# Patient Record
Sex: Female | Born: 1990
Health system: Southern US, Community
[De-identification: ages and names within clinical notes are randomized; demographics above are authoritative.]

## PROBLEM LIST (undated history)

## (undated) ENCOUNTER — Inpatient Hospital Stay (HOSPITAL_COMMUNITY): Payer: Self-pay

## (undated) DIAGNOSIS — B999 Unspecified infectious disease: Secondary | ICD-10-CM

## (undated) DIAGNOSIS — A6000 Herpesviral infection of urogenital system, unspecified: Secondary | ICD-10-CM

## (undated) DIAGNOSIS — D649 Anemia, unspecified: Secondary | ICD-10-CM

## (undated) HISTORY — PX: NO PAST SURGERIES: SHX2092

---
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
## 2011-05-12 ENCOUNTER — Other Ambulatory Visit: Payer: Self-pay | Admitting: Family Medicine

## 2011-05-13 ENCOUNTER — Ambulatory Visit
Admission: RE | Admit: 2011-05-13 | Discharge: 2011-05-13 | Disposition: A | Discharge status: Home / Self Care | Payer: Federal, State, Local not specified - PPO | Source: Ambulatory Visit | Attending: Family Medicine | Admitting: Family Medicine

## 2013-06-13 ENCOUNTER — Other Ambulatory Visit (HOSPITAL_COMMUNITY)
Admission: RE | Admit: 2013-06-13 | Discharge: 2013-06-13 | Disposition: A | Discharge status: Home / Self Care | Payer: Federal, State, Local not specified - PPO | Source: Ambulatory Visit | Attending: Obstetrics and Gynecology | Admitting: Obstetrics and Gynecology

## 2013-06-13 DIAGNOSIS — Z01419 Encounter for gynecological examination (general) (routine) without abnormal findings: Secondary | ICD-10-CM | POA: Insufficient documentation

## 2013-06-13 DIAGNOSIS — Z113 Encounter for screening for infections with a predominantly sexual mode of transmission: Secondary | ICD-10-CM | POA: Insufficient documentation

## 2014-06-12 ENCOUNTER — Other Ambulatory Visit (HOSPITAL_COMMUNITY)
Admission: RE | Admit: 2014-06-12 | Discharge: 2014-06-12 | Disposition: A | Discharge status: Home / Self Care | Payer: Federal, State, Local not specified - PPO | Source: Ambulatory Visit | Attending: Obstetrics and Gynecology | Admitting: Obstetrics and Gynecology

## 2014-06-12 ENCOUNTER — Other Ambulatory Visit: Payer: Self-pay | Admitting: Obstetrics and Gynecology

## 2014-06-12 DIAGNOSIS — Z01419 Encounter for gynecological examination (general) (routine) without abnormal findings: Secondary | ICD-10-CM | POA: Insufficient documentation

## 2014-06-12 DIAGNOSIS — Z1151 Encounter for screening for human papillomavirus (HPV): Secondary | ICD-10-CM | POA: Insufficient documentation

## 2014-06-16 LAB — CYTOLOGY - PAP

## 2015-06-12 ENCOUNTER — Emergency Department (HOSPITAL_COMMUNITY)
Admission: EM | Admit: 2015-06-12 | Discharge: 2015-06-12 | Disposition: A | Discharge status: Home / Self Care | Payer: Federal, State, Local not specified - PPO | Attending: Emergency Medicine | Admitting: Emergency Medicine

## 2015-06-12 ENCOUNTER — Encounter (HOSPITAL_COMMUNITY): Payer: Self-pay

## 2015-06-12 DIAGNOSIS — M791 Myalgia, unspecified site: Secondary | ICD-10-CM

## 2015-06-12 DIAGNOSIS — Z3202 Encounter for pregnancy test, result negative: Secondary | ICD-10-CM | POA: Diagnosis not present

## 2015-06-12 DIAGNOSIS — G4489 Other headache syndrome: Secondary | ICD-10-CM

## 2015-06-12 DIAGNOSIS — R109 Unspecified abdominal pain: Secondary | ICD-10-CM | POA: Diagnosis not present

## 2015-06-12 DIAGNOSIS — R52 Pain, unspecified: Secondary | ICD-10-CM | POA: Diagnosis present

## 2015-06-12 DIAGNOSIS — J039 Acute tonsillitis, unspecified: Secondary | ICD-10-CM | POA: Diagnosis not present

## 2015-06-12 LAB — URINALYSIS, ROUTINE W REFLEX MICROSCOPIC
BILIRUBIN URINE: NEGATIVE
Glucose, UA: NEGATIVE mg/dL
KETONES UR: 15 mg/dL — AB
NITRITE: NEGATIVE
PROTEIN: NEGATIVE mg/dL
Specific Gravity, Urine: 1.03 (ref 1.005–1.030)
UROBILINOGEN UA: 1 mg/dL (ref 0.0–1.0)
pH: 7 (ref 5.0–8.0)

## 2015-06-12 LAB — PREGNANCY, URINE: PREG TEST UR: NEGATIVE

## 2015-06-12 LAB — RAPID STREP SCREEN (MED CTR MEBANE ONLY): STREPTOCOCCUS, GROUP A SCREEN (DIRECT): NEGATIVE

## 2015-06-12 LAB — URINE MICROSCOPIC-ADD ON

## 2015-06-12 MED ORDER — TRAMADOL HCL 50 MG PO TABS: 50.0000 mg | ORAL_TABLET | Freq: Four times a day (QID) | ORAL | Status: DC | PRN

## 2015-06-12 MED ORDER — PENICILLIN V POTASSIUM 500 MG PO TABS: 500.0000 mg | ORAL_TABLET | Freq: Two times a day (BID) | ORAL | Status: AC

## 2015-06-12 MED ORDER — IBUPROFEN 600 MG PO TABS: 600.0000 mg | ORAL_TABLET | Freq: Four times a day (QID) | ORAL | Status: DC | PRN

## 2015-06-12 MED ORDER — DEXAMETHASONE SODIUM PHOSPHATE 10 MG/ML IJ SOLN
10.0000 mg | Freq: Once | INTRAMUSCULAR | Status: AC
  Administered 2015-06-12: 10 mg via INTRAMUSCULAR
  Filled 2015-06-12: qty 1

## 2015-06-12 MED ORDER — ACETAMINOPHEN 325 MG PO TABS
650.0000 mg | ORAL_TABLET | ORAL | Status: DC | PRN
  Administered 2015-06-12: 650 mg via ORAL
  Filled 2015-06-12 (×2): qty 2

## 2015-06-12 MED ORDER — KETOROLAC TROMETHAMINE 60 MG/2ML IM SOLN
60.0000 mg | Freq: Once | INTRAMUSCULAR | Status: AC
  Administered 2015-06-12: 60 mg via INTRAMUSCULAR
  Filled 2015-06-12: qty 2

## 2015-06-12 NOTE — ED Notes (Signed)
Per pt she started having  Sudden onset of generalized body aches at 1900 this evening; pt states she also has a sore throat; pt denies n/v; Pt states she believes she had a fever earlier but has not taking any meds; Pt a&ox 4 on arrival

## 2015-06-12 NOTE — Discharge Instructions (Signed)
Tonsillitis Tonsillitis is an infection of the throat that causes the tonsils to become red, tender, and swollen. Tonsils are collections of lymphoid tissue at the back of the throat. Each tonsil has crevices (crypts). Tonsils help fight nose and throat infections and keep infection from spreading to other parts of the body for the first 18 months of life.  CAUSES Sudden (acute) tonsillitis is usually caused by infection with streptococcal bacteria. Long-lasting (chronic) tonsillitis occurs when the crypts of the tonsils become filled with pieces of food and bacteria, which makes it easy for the tonsils to become repeatedly infected. SYMPTOMS  Symptoms of tonsillitis include:  A sore throat, with possible difficulty swallowing.  White patches on the tonsils.  Fever.  Tiredness.  New episodes of snoring during sleep, when you did not snore before.  Small, foul-smelling, yellowish-white pieces of material (tonsilloliths) that you occasionally cough up or spit out. The tonsilloliths can also cause you to have bad breath. DIAGNOSIS Tonsillitis can be diagnosed through a physical exam. Diagnosis can be confirmed with the results of lab tests, including a throat culture. TREATMENT  The goals of tonsillitis treatment include the reduction of the severity and duration of symptoms and prevention of associated conditions. Symptoms of tonsillitis can be improved with the use of steroids to reduce the swelling. Tonsillitis caused by bacteria can be treated with antibiotic medicines. Usually, treatment with antibiotic medicines is started before the cause of the tonsillitis is known. However, if it is determined that the cause is not bacterial, antibiotic medicines will not treat the tonsillitis. If attacks of tonsillitis are severe and frequent, your health care provider may recommend surgery to remove the tonsils (tonsillectomy). HOME CARE INSTRUCTIONS   Rest as much as possible and get plenty of  sleep.  Drink plenty of fluids. While the throat is very sore, eat soft foods or liquids, such as sherbet, soups, or instant breakfast drinks.  Eat frozen ice pops.  Gargle with a warm or cold liquid to help soothe the throat. Mix 1/4 teaspoon of salt and 1/4 teaspoon of baking soda in 8 oz of water. SEEK MEDICAL CARE IF:   Large, tender lumps develop in your neck.  A rash develops.  A green, yellow-brown, or bloody substance is coughed up.  You are unable to swallow liquids or food for 24 hours.  You notice that only one of the tonsils is swollen. SEEK IMMEDIATE MEDICAL CARE IF:   You develop any new symptoms such as vomiting, severe headache, stiff neck, chest pain, or trouble breathing or swallowing.  You have severe throat pain along with drooling or voice changes.  You have severe pain, unrelieved with recommended medications.  You are unable to fully open the mouth.  You develop redness, swelling, or severe pain anywhere in the neck.  You have a fever. MAKE SURE YOU:   Understand these instructions.  Will watch your condition.  Will get help right away if you are not doing well or get worse. Document Released: 06/08/2005 Document Revised: 01/13/2014 Document Reviewed: 02/15/2013 [facility] Patient Information 2015 [place], [place]. This information is not intended to replace advice given to you by your health care provider. Make sure you discuss any questions you have with your health care provider.  Muscle Pain Muscle pain (myalgia) may be caused by many things, including:  Overuse or muscle strain, especially if you are not in shape. This is the most common cause of muscle pain.  Injury.  Bruises.  Viruses, such as the  flu.  Infectious diseases.  Fibromyalgia, which is a chronic condition that causes muscle tenderness, fatigue, and headache.  Autoimmune diseases, including lupus.  Certain drugs, including ACE inhibitors and statins. Muscle pain may be  mild or severe. In most cases, the pain lasts only a short time and goes away without treatment. To diagnose the cause of your muscle pain, your health care provider will take your medical history. This means he or she will ask you when your muscle pain began and what has been happening. If you have not had muscle pain for very long, your health care provider may want to wait before doing much testing. If your muscle pain has lasted a long time, your health care provider may want to run tests right away. If your health care provider thinks your muscle pain may be caused by illness, you may need to have additional tests to rule out certain conditions.  Treatment for muscle pain depends on the cause. Home care is often enough to relieve muscle pain. Your health care provider may also prescribe anti-inflammatory medicine. HOME CARE INSTRUCTIONS Watch your condition for any changes. The following actions may help to lessen any discomfort you are feeling:  Only take over-the-counter or prescription medicines as directed by your health care provider.  Apply ice to the sore muscle:  Put ice in a plastic bag.  Place a towel between your skin and the bag.  Leave the ice on for 15-20 minutes, 3-4 times a day.  You may alternate applying hot and cold packs to the muscle as directed by your health care provider.  If overuse is causing your muscle pain, slow down your activities until the pain goes away.  Remember that it is normal to feel some muscle pain after starting a workout program. Muscles that have not been used often will be sore at first.  Do regular, gentle exercises if you are not usually active.  Warm up before exercising to lower your risk of muscle pain.  Do not continue working out if the pain is very bad. Bad pain could mean you have injured a muscle. SEEK MEDICAL CARE IF:  Your muscle pain gets worse, and medicines do not help.  You have muscle pain that lasts longer than 3  days.  You have a rash or fever along with muscle pain.  You have muscle pain after a tick bite.  You have muscle pain while working out, even though you are in good physical condition.  You have redness, soreness, or swelling along with muscle pain.  You have muscle pain after starting a new medicine or changing the dose of a medicine. SEEK IMMEDIATE MEDICAL CARE IF:  You have trouble breathing.  You have trouble swallowing.  You have muscle pain along with a stiff neck, fever, and vomiting.  You have severe muscle weakness or cannot move part of your body. MAKE SURE YOU:   Understand these instructions.  Will watch your condition.  Will get help right away if you are not doing well or get worse. Document Released: 07/21/2006 Document Revised: 09/03/2013 Document Reviewed: 06/25/2013 [facility] Patient Information 2015 [place], [place]. This information is not intended to replace advice given to you by your health care provider. Make sure you discuss any questions you have with your health care provider.  General Headache Without Cause A headache is pain or discomfort felt around the head or neck area. The specific cause of a headache may not be found. There are many causes and  types of headaches. A few common ones are:  Tension headaches.  Migraine headaches.  Cluster headaches.  Chronic daily headaches. HOME CARE INSTRUCTIONS   Keep all follow-up appointments with your caregiver or any specialist referral.  Only take over-the-counter or prescription medicines for pain or discomfort as directed by your caregiver.  Lie down in a dark, quiet room when you have a headache.  Keep a headache journal to find out what may trigger your migraine headaches. For example, write down:  What you eat and drink.  How much sleep you get.  Any change to your diet or medicines.  Try massage or other relaxation techniques.  Put ice packs or heat on the head and neck. Use these 3  to 4 times per day for 15 to 20 minutes each time, or as needed.  Limit stress.  Sit up straight, and do not tense your muscles.  Quit smoking if you smoke.  Limit alcohol use.  Decrease the amount of caffeine you drink, or stop drinking caffeine.  Eat and sleep on a regular schedule.  Get 7 to 9 hours of sleep, or as recommended by your caregiver.  Keep lights dim if bright lights bother you and make your headaches worse. SEEK MEDICAL CARE IF:   You have problems with the medicines you were prescribed.  Your medicines are not working.  You have a change from the usual headache.  You have nausea or vomiting. SEEK IMMEDIATE MEDICAL CARE IF:   Your headache becomes severe.  You have a fever.  You have a stiff neck.  You have loss of vision.  You have muscular weakness or loss of muscle control.  You start losing your balance or have trouble walking.  You feel faint or pass out.  You have severe symptoms that are different from your first symptoms. MAKE SURE YOU:   Understand these instructions.  Will watch your condition.  Will get help right away if you are not doing well or get worse. Document Released: 08/29/2005 Document Revised: 11/21/2011 Document Reviewed: 09/14/2011 [facility] Patient Information 2015 [place], [place]. This information is not intended to replace advice given to you by your health care provider. Make sure you discuss any questions you have with your health care provider.

## 2015-06-12 NOTE — ED Provider Notes (Signed)
CSN: [id number]     Arrival date & time 06/12/15  0235 History   By signing my name below, I, Arlan Organ, attest that this documentation has been prepared under the direction and in the presence of Loren Racer, MD. Electronically Signed: Arlan Organ, ED Scribe. 06/12/2015. 3:08 AM.    Chief Complaint  Patient presents with  . Generalized Body Aches   The history is provided by the patient. No language interpreter was used.    HPI Comments: Susan Cross is a 24 y.o. female without any pertinent past medical history who presents to the Emergency Department complaining of constant, ongoing generalized body aches and sore throat onset 1900 this evening. Subjective fever, frontal headache, diffuse abdominal pain, and chills also reported. No OTC medications attempted prior to arrival. No recent nausea, vomiting, chest pain, or shortness of breath. Pt is unaware of any known sick contacts, however, she currently works as a Public house manager. No recent long distance travel. She is currently on her menstrual cycle. Denies urinary symptoms  History reviewed. No pertinent past medical history. History reviewed. No pertinent past surgical history. History reviewed. No pertinent family history. Social History  Substance Use Topics  . Smoking status: Never Smoker   . Smokeless tobacco: None  . Alcohol Use: Yes     Comment: occiasionally    OB History    No data available     Review of Systems  Constitutional: Positive for chills. Negative for fever.  HENT: Positive for sore throat. Negative for congestion and sinus pressure.   Eyes: Negative for photophobia.  Respiratory: Negative for cough and shortness of breath.   Cardiovascular: Negative for chest pain.  Gastrointestinal: Positive for abdominal pain. Negative for nausea, vomiting and diarrhea.  Genitourinary: Negative for dysuria.  Musculoskeletal: Positive for myalgias. Negative for back pain.  Skin: Negative for rash.   Neurological: Positive for headaches. Negative for dizziness, weakness, light-headedness and numbness.  Psychiatric/Behavioral: Negative for confusion.  All other systems reviewed and are negative.     Allergies  Review of patient's allergies indicates not on file.  Home Medications   Prior to Admission medications   Medication Sig Start Date End Date Taking? Authorizing Provider  ibuprofen (ADVIL,MOTRIN) 600 MG tablet Take 1 tablet (600 mg total) by mouth every 6 (six) hours as needed. 06/12/15   Loren Racer, MD  penicillin v potassium (VEETID) 500 MG tablet Take 1 tablet (500 mg total) by mouth 2 (two) times daily. 06/12/15 06/21/15  Loren Racer, MD  traMADol (ULTRAM) 50 MG tablet Take 1 tablet (50 mg total) by mouth every 6 (six) hours as needed. 06/12/15   Loren Racer, MD   Triage Vitals: BP 108/84 mmHg  Pulse 93  Temp(Src) 99 F (37.2 C) (Oral)  Resp 20  Ht  (1.549 m)  Wt 140 lb (63.504 kg)  BMI 26.47 kg/m2  SpO2 100%  LMP 06/12/2015  Physical Exam  Constitutional: She is oriented to person, place, and time. She appears well-developed and well-nourished. No distress.  HENT:  Head: Normocephalic and atraumatic.  Mouth/Throat: Oropharynx is clear and moist.  Bilateral tonsillar hypertrophy with erythema. No definite exudates  Eyes: EOM are normal. Pupils are equal, round, and reactive to light.  Neck: Normal range of motion. Neck supple.  No meningismus  Cardiovascular: Normal rate and regular rhythm.  Exam reveals no gallop and no friction rub.   No murmur heard. Pulmonary/Chest: Effort normal and breath sounds normal. No respiratory distress. She has no wheezes.  She has no rales. She exhibits no tenderness.  Abdominal: Soft. Bowel sounds are normal. She exhibits no distension and no mass. There is tenderness (mild suprapubic tenderness to palpation. No rebound or guarding.). There is no rebound and no guarding.  Musculoskeletal: Normal range of motion.  She exhibits no edema or tenderness.  No CVA tenderness bilaterally. No lower extremity swelling or pain.  Lymphadenopathy:    She has cervical adenopathy.  Neurological: She is alert and oriented to person, place, and time.  Patient is alert and oriented x3 with clear, goal oriented speech. Patient has 5/5 motor in all extremities. Sensation is intact to light touch. Patient has a normal gait and walks without assistance.  Skin: Skin is warm and dry. No rash noted. No erythema.  Psychiatric: She has a normal mood and affect. Her behavior is normal.  Nursing note and vitals reviewed.   ED Course  Procedures (including critical care time)  DIAGNOSTIC STUDIES: Oxygen Saturation is 99% on RA, Normal by my interpretation.    COORDINATION OF CARE: 3:08 AM-Discussed treatment plan with pt at bedside and pt agreed to plan.     Labs Review Labs Reviewed  URINALYSIS, ROUTINE W REFLEX MICROSCOPIC (NOT AT [facility]) - Abnormal; Notable for the following:    APPearance CLOUDY (*)    Hgb urine dipstick LARGE (*)    Ketones, ur 15 (*)    Leukocytes, UA SMALL (*)    All other components within normal limits  URINE MICROSCOPIC-ADD ON - Abnormal; Notable for the following:    Squamous Epithelial / LPF FEW (*)    All other components within normal limits  RAPID STREP SCREEN (NOT AT [facility])  CULTURE, GROUP A STREP  PREGNANCY, URINE    Imaging Review No results found. I have personally reviewed and evaluated these images and lab results as part of my medical decision-making.   EKG Interpretation None      MDM   Final diagnoses:  Tonsillitis  Other headache syndrome  Myalgia    I personally performed the services described in this documentation, which was scribed in my presence. The recorded information has been reviewed and is accurate.  Patient states she is feeling much better after medication. Vital signs remained stable. No meningismus. Normal neurologic exam. Though considered, I  believe encephalitis/meningitis is very unlikely. With physical exam suspect likely early tonsillitis though rapid strep is negative. Patient may also have flulike illness. We'll treat with antibiotics and anti-inflammatory medication. Return precautions have been given.  Loren Racer, MD 06/12/15 0430

## 2015-06-14 LAB — CULTURE, GROUP A STREP: STREP A CULTURE: NEGATIVE

## 2015-07-28 ENCOUNTER — Other Ambulatory Visit: Payer: Self-pay | Admitting: Obstetrics and Gynecology

## 2015-07-28 ENCOUNTER — Other Ambulatory Visit (HOSPITAL_COMMUNITY)
Admission: RE | Admit: 2015-07-28 | Discharge: 2015-07-28 | Disposition: A | Discharge status: Home / Self Care | Payer: Federal, State, Local not specified - PPO | Source: Ambulatory Visit | Attending: Obstetrics and Gynecology | Admitting: Obstetrics and Gynecology

## 2015-07-28 DIAGNOSIS — Z01411 Encounter for gynecological examination (general) (routine) with abnormal findings: Secondary | ICD-10-CM | POA: Diagnosis not present

## 2015-07-28 DIAGNOSIS — Z113 Encounter for screening for infections with a predominantly sexual mode of transmission: Secondary | ICD-10-CM | POA: Diagnosis present

## 2015-07-31 LAB — CYTOLOGY - PAP

## 2015-08-24 ENCOUNTER — Emergency Department (HOSPITAL_COMMUNITY): Payer: Federal, State, Local not specified - PPO

## 2015-08-24 ENCOUNTER — Encounter (HOSPITAL_COMMUNITY): Payer: Self-pay

## 2015-08-24 ENCOUNTER — Emergency Department (HOSPITAL_COMMUNITY)
Admission: EM | Admit: 2015-08-24 | Discharge: 2015-08-24 | Disposition: A | Discharge status: Home / Self Care | Payer: Federal, State, Local not specified - PPO | Attending: Emergency Medicine | Admitting: Emergency Medicine

## 2015-08-24 DIAGNOSIS — Z79899 Other long term (current) drug therapy: Secondary | ICD-10-CM | POA: Diagnosis not present

## 2015-08-24 DIAGNOSIS — Y9389 Activity, other specified: Secondary | ICD-10-CM | POA: Insufficient documentation

## 2015-08-24 DIAGNOSIS — S20212A Contusion of left front wall of thorax, initial encounter: Secondary | ICD-10-CM | POA: Insufficient documentation

## 2015-08-24 DIAGNOSIS — S00512A Abrasion of oral cavity, initial encounter: Secondary | ICD-10-CM | POA: Diagnosis not present

## 2015-08-24 DIAGNOSIS — Z3202 Encounter for pregnancy test, result negative: Secondary | ICD-10-CM | POA: Diagnosis not present

## 2015-08-24 DIAGNOSIS — S199XXA Unspecified injury of neck, initial encounter: Secondary | ICD-10-CM | POA: Diagnosis present

## 2015-08-24 DIAGNOSIS — S3992XA Unspecified injury of lower back, initial encounter: Secondary | ICD-10-CM | POA: Insufficient documentation

## 2015-08-24 DIAGNOSIS — S79912A Unspecified injury of left hip, initial encounter: Secondary | ICD-10-CM | POA: Diagnosis not present

## 2015-08-24 DIAGNOSIS — Z793 Long term (current) use of hormonal contraceptives: Secondary | ICD-10-CM | POA: Insufficient documentation

## 2015-08-24 DIAGNOSIS — M25552 Pain in left hip: Secondary | ICD-10-CM

## 2015-08-24 DIAGNOSIS — Y92410 Unspecified street and highway as the place of occurrence of the external cause: Secondary | ICD-10-CM | POA: Diagnosis not present

## 2015-08-24 DIAGNOSIS — S8992XA Unspecified injury of left lower leg, initial encounter: Secondary | ICD-10-CM | POA: Insufficient documentation

## 2015-08-24 DIAGNOSIS — S3991XA Unspecified injury of abdomen, initial encounter: Secondary | ICD-10-CM | POA: Diagnosis not present

## 2015-08-24 DIAGNOSIS — Y998 Other external cause status: Secondary | ICD-10-CM | POA: Insufficient documentation

## 2015-08-24 DIAGNOSIS — S161XXA Strain of muscle, fascia and tendon at neck level, initial encounter: Secondary | ICD-10-CM | POA: Diagnosis not present

## 2015-08-24 LAB — POC URINE PREG, ED: Preg Test, Ur: NEGATIVE

## 2015-08-24 MED ORDER — IBUPROFEN 800 MG PO TABS: 800.0000 mg | ORAL_TABLET | Freq: Three times a day (TID) | ORAL | Status: DC

## 2015-08-24 MED ORDER — METHOCARBAMOL 500 MG PO TABS
500.0000 mg | ORAL_TABLET | Freq: Once | ORAL | Status: AC
  Administered 2015-08-24: 500 mg via ORAL
  Filled 2015-08-24: qty 1

## 2015-08-24 MED ORDER — HYDROCODONE-ACETAMINOPHEN 5-325 MG PO TABS
1.0000 | ORAL_TABLET | Freq: Once | ORAL | Status: AC
  Administered 2015-08-24: 1 via ORAL
  Filled 2015-08-24: qty 1

## 2015-08-24 MED ORDER — METHOCARBAMOL 500 MG PO TABS: 500.0000 mg | ORAL_TABLET | Freq: Two times a day (BID) | ORAL | Status: DC
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
## 2015-12-17 ENCOUNTER — Emergency Department (HOSPITAL_COMMUNITY)
Admission: EM | Admit: 2015-12-17 | Discharge: 2015-12-17 | Disposition: A | Discharge status: Home / Self Care | Payer: Federal, State, Local not specified - PPO | Attending: Emergency Medicine | Admitting: Emergency Medicine

## 2015-12-17 ENCOUNTER — Emergency Department (HOSPITAL_COMMUNITY): Payer: Federal, State, Local not specified - PPO

## 2015-12-17 DIAGNOSIS — R51 Headache: Secondary | ICD-10-CM | POA: Insufficient documentation

## 2015-12-17 DIAGNOSIS — Z791 Long term (current) use of non-steroidal anti-inflammatories (NSAID): Secondary | ICD-10-CM | POA: Insufficient documentation

## 2015-12-17 DIAGNOSIS — R091 Pleurisy: Secondary | ICD-10-CM | POA: Insufficient documentation

## 2015-12-17 DIAGNOSIS — R05 Cough: Secondary | ICD-10-CM | POA: Diagnosis present

## 2015-12-17 DIAGNOSIS — R197 Diarrhea, unspecified: Secondary | ICD-10-CM | POA: Insufficient documentation

## 2015-12-17 DIAGNOSIS — R059 Cough, unspecified: Secondary | ICD-10-CM

## 2015-12-17 DIAGNOSIS — Z79899 Other long term (current) drug therapy: Secondary | ICD-10-CM | POA: Insufficient documentation

## 2015-12-17 DIAGNOSIS — R079 Chest pain, unspecified: Secondary | ICD-10-CM | POA: Insufficient documentation

## 2015-12-17 DIAGNOSIS — R110 Nausea: Secondary | ICD-10-CM | POA: Insufficient documentation

## 2015-12-17 MED ORDER — PROMETHAZINE-DM 6.25-15 MG/5ML PO SYRP
5.0000 mL | ORAL_SOLUTION | Freq: Four times a day (QID) | ORAL | Status: DC | PRN
Start: 2015-12-17 — End: 2016-08-06

## 2015-12-17 NOTE — ED Notes (Signed)
Anterior CP with cough, non-productive, c/o nausea. States had one episode of diarrhea this am. LMP 3 weeks ago.

## 2015-12-17 NOTE — ED Notes (Signed)
Pt c/o non productive cough onset x 2 days with CP with cough, pt c/o HA & nausea, pt reports x 1 liquid stool yesterday, pt denies sore throat & nasal congestion, pt denies runny nose, pt A&O x4

## 2015-12-17 NOTE — ED Provider Notes (Signed)
CSN: [id number]     Arrival date & time 12/17/15  1031 History  By signing my name below, I, Ronney LionSuzanne Le, attest that this documentation has been prepared under the direction and in the presence of Fayrene HelperBowie Careena Degraffenreid, PA-C. Electronically Signed: Ronney LionSuzanne Le, ED Scribe. 12/17/2015. 12:07 PM.   Chief Complaint  Patient presents with  . Cough   The history is provided by the patient. No language interpreter was used.    HPI Comments: Susan Cross is a 25 y.o. female who presents to the Emergency Department complaining of a gradual-onset, constant, moderate, dry cough that began 2 days ago. She complains of associated chest pain that occurs only with cough, headache, nausea, and one episode of diarrhea that occurred this morning. Patient states she works in a [employer] community and has suspected sick contact there. Patient states she takes OCP's but denies any history of blood clots, recent surgeries, recent travel or long periods of immobilization. She notes a paternal history of CAD <55 (MI in his 1640's, per pt) but denies a personal history of any cardiac issues. She denies the possibility of pregnancy (LMP 3 weeks ago). She denies rhinorrhea, congestion, sneezing, otalgia, sore throat, fever, chills, vomiting, difficulty urinating, or DOE.    No past medical history on file. No past surgical history on file. No family history on file. Social History  Substance Use Topics  . Smoking status: Never Smoker   . Smokeless tobacco: Not on file  . Alcohol Use: Yes     Comment: occiasionally    OB History    No data available     Review of Systems  Constitutional: Negative for fever and chills.  HENT: Negative for congestion, ear pain, rhinorrhea, sneezing and sore throat.   Respiratory: Positive for cough. Negative for shortness of breath.   Cardiovascular: Positive for chest pain (secondary to cough).  Gastrointestinal: Positive for nausea and diarrhea. Negative for vomiting.  Genitourinary: Negative  for difficulty urinating.  Neurological: Positive for headaches.     Allergies  Review of patient's allergies indicates no known allergies.  Home Medications   Prior to Admission medications   Medication Sig Start Date End Date Taking? Authorizing Provider  estradiol (ESTRACE) 0.1 MG/GM vaginal cream Place 1 Applicatorful vaginally daily.    Historical Provider, MD  ibuprofen (ADVIL,MOTRIN) 800 MG tablet Take 1 tablet (800 mg total) by mouth 3 (three) times daily. 08/24/15   Elpidio AnisShari Upstill, PA-C  methocarbamol (ROBAXIN) 500 MG tablet Take 1 tablet (500 mg total) by mouth 2 (two) times daily. 08/24/15   Elpidio AnisShari Upstill, PA-C  norethindrone-ethinyl estradiol 1/35 (NORTREL 1/35, 21,) tablet Take 1 tablet by mouth daily.    Historical Provider, MD  traMADol (ULTRAM) 50 MG tablet Take 1 tablet (50 mg total) by mouth every 6 (six) hours as needed. Patient not taking: Reported on 08/24/2015 06/12/15   Loren Raceravid Yelverton, MD  valACYclovir (VALTREX) 500 MG tablet Take 500 mg by mouth daily.    Historical Provider, MD   BP 131/95 mmHg  Pulse 98  Temp(Src) 99 F (37.2 C) (Oral)  Resp 16  SpO2 100%  LMP 11/19/2015 Physical Exam  Constitutional: She is oriented to person, place, and time. She appears well-developed and well-nourished. No distress.  HENT:  Head: Normocephalic and atraumatic.  Right Ear: Hearing, tympanic membrane, external ear and ear canal normal.  Left Ear: Hearing, tympanic membrane, external ear and ear canal normal.  Nose: Nose normal.  Mouth/Throat: Uvula is midline, oropharynx is clear and moist  and mucous membranes are normal. No trismus in the jaw. No uvula swelling. No oropharyngeal exudate, posterior oropharyngeal edema, posterior oropharyngeal erythema or tonsillar abscesses.  Ears normal. Nose normal. Throat is normal.   Eyes: Conjunctivae and EOM are normal.  Neck: Neck supple. No tracheal deviation present.  Cardiovascular: Normal rate, regular rhythm and normal heart  sounds.  Exam reveals no gallop and no friction rub.   No murmur heard. Pulmonary/Chest: Effort normal. No respiratory distress. She has no wheezes. She has no rales.  Musculoskeletal: Normal range of motion.  Neurological: She is alert and oriented to person, place, and time.  Skin: Skin is warm and dry.  Psychiatric: She has a normal mood and affect. Her behavior is normal.  Nursing note and vitals reviewed.   ED Course  Procedures (including critical care time)  DIAGNOSTIC STUDIES: Oxygen Saturation is 100% on RA, notmal by my interpretation.    COORDINATION OF CARE: 11:57 AM - Low suspicion for blood clot, given pt's history and physical exam. Discussed pros and cons of drawing D-dimer labs; pt declines labs at this time. Discussed treatment plan with pt at bedside which includes symptomatic cough medications. Strict return precautions discussed. Pt verbalized understanding and agreed to plan.   Imaging Review Dg Chest 2 View  12/17/2015  CLINICAL DATA:  Nonproductive cough. EXAM: CHEST  2 VIEW COMPARISON:  08/24/2015. FINDINGS: Mediastinum and hilar structures normal. Lungs are clear. Heart size normal. No pleural effusion or pneumothorax. No acute bony abnormality. IMPRESSION: No acute cardiopulmonary disease.  Chest is stable from prior exam . Electronically Signed   By: Maisie Fus  Register   On: 12/17/2015 11:41   I have personally reviewed and evaluated these images and lab results as part of my medical decision-making.  MDM   Final diagnoses:  None   Pt CXR negative for acute infiltrate. Patients symptoms are consistent with URI, likely viral etiology. Discussed that antibiotics are not indicated for viral infections. Pt will be discharged with symptomatic treatment (Promethazine-DM).  Pt does take oral birth control and does report pleuritic chest pain, however does not have any other significant risk factors for PE.  I did discussed option of evaluating including d-dimer.  Pt  select to return if her condition worsen.  Also, doubt ACS.  Verbalizes understanding and is agreeable with plan. Pt is hemodynamically stable & in NAD prior to dc.   BP 131/95 mmHg  Pulse 98  Temp(Src) 99 F (37.2 C) (Oral)  Resp 16  SpO2 100%  LMP 11/19/2015  I personally performed the services described in this documentation, which was scribed in my presence. The recorded information has been reviewed and is accurate.       Fayrene Helper, PA-C 12/17/15 1210  Benjiman Core, MD 12/18/15 [phone]

## 2015-12-17 NOTE — Discharge Instructions (Signed)
Return to the ER if you develop bloody cough, increase shortness of breath while walking or if you have any concerns.   Cough, Adult Coughing is a reflex that clears your throat and your airways. Coughing helps to heal and protect your lungs. It is normal to cough occasionally, but a cough that happens with other symptoms or lasts a long time may be a sign of a condition that needs treatment. A cough may last only 2-3 weeks (acute), or it may last longer than 8 weeks (chronic). CAUSES Coughing is commonly caused by:  Breathing in substances that irritate your lungs.  A viral or bacterial respiratory infection.  Allergies.  Asthma.  Postnasal drip.  Smoking.  Acid backing up from the stomach into the esophagus (gastroesophageal reflux).  Certain medicines.  Chronic lung problems, including COPD (or rarely, lung cancer).  Other medical conditions such as heart failure. HOME CARE INSTRUCTIONS  Pay attention to any changes in your symptoms. Take these actions to help with your discomfort:  Take medicines only as told by your health care provider.  If you were prescribed an antibiotic medicine, take it as told by your health care provider. Do not stop taking the antibiotic even if you start to feel better.  Talk with your health care provider before you take a cough suppressant medicine.  Drink enough fluid to keep your urine clear or pale yellow.  If the air is dry, use a cold steam vaporizer or humidifier in your bedroom or your home to help loosen secretions.  Avoid anything that causes you to cough at work or at home.  If your cough is worse at night, try sleeping in a semi-upright position.  Avoid cigarette smoke. If you smoke, quit smoking. If you need help quitting, ask your health care provider.  Avoid caffeine.  Avoid alcohol.  Rest as needed. SEEK MEDICAL CARE IF:   You have new symptoms.  You cough up pus.  Your cough does not get better after 2-3 weeks,  or your cough gets worse.  You cannot control your cough with suppressant medicines and you are losing sleep.  You develop pain that is getting worse or pain that is not controlled with pain medicines.  You have a fever.  You have unexplained weight loss.  You have night sweats. SEEK IMMEDIATE MEDICAL CARE IF:  You cough up blood.  You have difficulty breathing.  Your heartbeat is very fast.   This information is not intended to replace advice given to you by your health care provider. Make sure you discuss any questions you have with your health care provider.   Document Released: 02/25/2011 Document Revised: 05/20/2015 Document Reviewed: 11/05/2014 Elsevier Interactive Patient Education [employer]2016 Elsevier Inc.

## 2016-08-06 ENCOUNTER — Inpatient Hospital Stay (HOSPITAL_COMMUNITY)
Admission: AD | Admit: 2016-08-06 | Discharge: 2016-08-06 | Disposition: A | Discharge status: Home / Self Care | Payer: Federal, State, Local not specified - PPO | Source: Ambulatory Visit | Attending: Obstetrics and Gynecology | Admitting: Obstetrics and Gynecology

## 2016-08-06 ENCOUNTER — Inpatient Hospital Stay (HOSPITAL_COMMUNITY): Payer: Federal, State, Local not specified - PPO

## 2016-08-06 ENCOUNTER — Encounter (HOSPITAL_COMMUNITY): Payer: Self-pay | Admitting: *Deleted

## 2016-08-06 DIAGNOSIS — R197 Diarrhea, unspecified: Secondary | ICD-10-CM

## 2016-08-06 DIAGNOSIS — O26891 Other specified pregnancy related conditions, first trimester: Secondary | ICD-10-CM | POA: Insufficient documentation

## 2016-08-06 DIAGNOSIS — Z3A01 Less than 8 weeks gestation of pregnancy: Secondary | ICD-10-CM | POA: Diagnosis not present

## 2016-08-06 DIAGNOSIS — Z79899 Other long term (current) drug therapy: Secondary | ICD-10-CM | POA: Diagnosis not present

## 2016-08-06 DIAGNOSIS — R109 Unspecified abdominal pain: Secondary | ICD-10-CM | POA: Insufficient documentation

## 2016-08-06 DIAGNOSIS — O26899 Other specified pregnancy related conditions, unspecified trimester: Secondary | ICD-10-CM

## 2016-08-06 HISTORY — DX: Anemia, unspecified: D64.9

## 2016-08-06 HISTORY — DX: Herpesviral infection of urogenital system, unspecified: A60.00

## 2016-08-06 LAB — WET PREP, GENITAL
Clue Cells Wet Prep HPF POC: NONE SEEN
Sperm: NONE SEEN
TRICH WET PREP: NONE SEEN
YEAST WET PREP: NONE SEEN

## 2016-08-06 LAB — CBC
HCT: 36.5 % (ref 36.0–46.0)
Hemoglobin: 12.2 g/dL (ref 12.0–15.0)
MCH: 26.2 pg (ref 26.0–34.0)
MCHC: 33.4 g/dL (ref 30.0–36.0)
MCV: 78.5 fL (ref 78.0–100.0)
Platelets: 288 10*3/uL (ref 150–400)
RBC: 4.65 MIL/uL (ref 3.87–5.11)
RDW: 13.9 % (ref 11.5–15.5)
WBC: 5.1 10*3/uL (ref 4.0–10.5)

## 2016-08-06 LAB — URINE MICROSCOPIC-ADD ON

## 2016-08-06 LAB — URINALYSIS, ROUTINE W REFLEX MICROSCOPIC
Bilirubin Urine: NEGATIVE
Glucose, UA: NEGATIVE mg/dL
KETONES UR: NEGATIVE mg/dL
Leukocytes, UA: NEGATIVE
NITRITE: NEGATIVE
PROTEIN: NEGATIVE mg/dL
pH: 5.5 (ref 5.0–8.0)

## 2016-08-06 LAB — POCT PREGNANCY, URINE: PREG TEST UR: POSITIVE — AB

## 2016-08-06 LAB — HCG, QUANTITATIVE, PREGNANCY: hCG, Beta Chain, Quant, S: 269 m[IU]/mL — ABNORMAL HIGH (ref <5)

## 2016-08-06 LAB — ABO/RH: ABO/RH(D): O POS

## 2016-08-06 NOTE — Discharge Instructions (Signed)
Bland Diet Introduction A bland diet consists of foods that do not have a lot of fat or fiber. Foods without fat or fiber are easier for the body to digest. They are also less likely to irritate your mouth, throat, stomach, and other parts of your gastrointestinal tract. A bland diet is sometimes called a BRAT diet. What is my plan? Your health care provider or dietitian may recommend specific changes to your diet to prevent and treat your symptoms, such as:  Eating small meals often.  Cooking food until it is soft enough to chew easily.  Chewing your food well.  Drinking fluids slowly.  Not eating foods that are very spicy, sour, or fatty.  Not eating citrus fruits, such as oranges and grapefruit. What do I need to know about this diet?  Eat a variety of foods from the bland diet food list.  Do not follow a bland diet longer than you have to.  Ask your health care provider whether you should take vitamins. What foods can I eat? Grains  Hot cereals, such as cream of wheat. Bread, crackers, or tortillas made from refined white flour. Rice. Vegetables  Canned or cooked vegetables. Mashed or boiled potatoes. Fruits  Bananas. Applesauce. Other types of cooked or canned fruit with the skin and seeds removed, such as canned peaches or pears. Meats and Other Protein Sources  Scrambled eggs. Creamy peanut butter or other nut butters. Lean, well-cooked meats, such as chicken or fish. Tofu. Soups or broths. Dairy  Low-fat dairy products, such as milk, cottage cheese, or yogurt. Beverages  Water. Herbal tea. Apple juice. Sweets and Desserts  Pudding. Custard. Fruit gelatin. Ice cream. Fats and Oils  Mild salad dressings. Canola or olive oil. The items listed above may not be a complete list of allowed foods or beverages. Contact your dietitian for more options.  What foods are not recommended? Foods and ingredients that are often not recommended include:  Spicy foods, such as hot  sauce or salsa.  Fried foods.  Sour foods, such as pickled or fermented foods.  Raw vegetables or fruits, especially citrus or berries.  Caffeinated drinks.  Alcohol.  Strongly flavored seasonings or condiments. The items listed above may not be a complete list of foods and beverages that are not allowed. Contact your dietitian for more information.  This information is not intended to replace advice given to you by your health care provider. Make sure you discuss any questions you have with your health care provider. Document Released: 12/21/2015 Document Revised: 02/04/2016 Document Reviewed: 09/10/2014  2017 Elsevier  

## 2016-08-06 NOTE — MAU Provider Note (Signed)
History     CSN: [id number]  Arrival date and time: 08/06/16 1750   First Provider Initiated Contact with Patient 08/06/16 1837      Chief Complaint  Patient presents with  . Possible Pregnancy  . Abdominal Cramping   HPI Susan Cross is a 25 y.o. G2P0010 at [redacted]w[redacted]d by LMP who presents with abdominal pain. Had positive HPT on 11/20. Abdominal pain started on Wednesday. Describes as intermittent lower abdominal cramping that is throbbing at times. Rates pain 7/10. Pain radiates to low back. Has not treated. Nothing makes better or worse. Denies n/v, fever/chills, vaginal bleeding, vaginal discharge. Has had watery stools for the last 3 days. Reports only 1 episode of diarrhea today. No sick contacts. Last saw Dr. Richardson Doppole 2 months ago.   OB History    Gravida Para Term Preterm AB Living   2       1     SAB TAB Ectopic Multiple Live Births   1              Past Medical History:  Diagnosis Date  . Anemia   . Genital herpes     Past Surgical History:  Procedure Laterality Date  . NO PAST SURGERIES      No family history on file.  Social History  Substance Use Topics  . Smoking status: Never Smoker  . Smokeless tobacco: Never Used  . Alcohol use Yes     Comment: occiasionally not since pregnancy    Allergies: No Known Allergies  Prescriptions Prior to Admission  Medication Sig Dispense Refill Last Dose  . estradiol (ESTRACE) 0.1 MG/GM vaginal cream Place 1 Applicatorful vaginally daily.   08/24/2015 at Unknown time  . ibuprofen (ADVIL,MOTRIN) 800 MG tablet Take 1 tablet (800 mg total) by mouth 3 (three) times daily. 21 tablet 0   . methocarbamol (ROBAXIN) 500 MG tablet Take 1 tablet (500 mg total) by mouth 2 (two) times daily. 20 tablet 0   . norethindrone-ethinyl estradiol 1/35 (NORTREL 1/35, 21,) tablet Take 1 tablet by mouth daily.   yesterday  . promethazine-dextromethorphan (PROMETHAZINE-DM) 6.25-15 MG/5ML syrup Take 5 mLs by mouth 4 (four) times daily as needed for  cough. 118 mL 0   . traMADol (ULTRAM) 50 MG tablet Take 1 tablet (50 mg total) by mouth every 6 (six) hours as needed. (Patient not taking: Reported on 08/24/2015) 15 tablet 0   . valACYclovir (VALTREX) 500 MG tablet Take 500 mg by mouth daily.   08/23/2015 at Unknown time    Review of Systems  Constitutional: Negative.  Negative for chills and fever.  Gastrointestinal: Positive for abdominal pain and diarrhea. Negative for blood in stool, constipation, nausea and vomiting.  Genitourinary: Negative.    Physical Exam   Blood pressure 126/81, pulse 97, temperature 98.3 F (36.8 C), temperature source Oral, resp. rate 16, height 5' 1.5" (1.562 m), weight 141 lb 9.6 oz (64.2 kg), last menstrual period 07/04/2016.  Physical Exam  Nursing note and vitals reviewed. Constitutional: She is oriented to person, place, and time. She appears well-developed and well-nourished. No distress.  HENT:  Head: Normocephalic and atraumatic.  Eyes: Conjunctivae are normal. Right eye exhibits no discharge. Left eye exhibits no discharge. No scleral icterus.  Neck: Normal range of motion.  Cardiovascular: Normal rate, regular rhythm and normal heart sounds.   No murmur heard. Respiratory: Effort normal and breath sounds normal. No respiratory distress. She has no wheezes.  GI: Soft. Bowel sounds are normal. She exhibits no  distension. There is no tenderness. There is no rebound and no guarding.  Genitourinary: Uterus normal. Cervix exhibits no motion tenderness. Right adnexum displays no mass and no tenderness. Left adnexum displays no mass and no tenderness.  Genitourinary Comments: Cervix closed  Neurological: She is alert and oriented to person, place, and time.  Skin: Skin is warm and dry. She is not diaphoretic.  Psychiatric: She has a normal mood and affect. Her behavior is normal. Judgment and thought content normal.    MAU Course  Procedures Results for orders placed or performed during the  hospital encounter of 08/06/16 (from the past 24 hour(s))  Urinalysis, Routine w reflex microscopic (not at [facility])     Status: Abnormal   Collection Time: 08/06/16  6:12 PM  Result Value Ref Range   Color, Urine YELLOW YELLOW   APPearance CLEAR CLEAR   Specific Gravity, Urine >1.030 (H) 1.005 - 1.030   pH 5.5 5.0 - 8.0   Glucose, UA NEGATIVE NEGATIVE mg/dL   Hgb urine dipstick TRACE (A) NEGATIVE   Bilirubin Urine NEGATIVE NEGATIVE   Ketones, ur NEGATIVE NEGATIVE mg/dL   Protein, ur NEGATIVE NEGATIVE mg/dL   Nitrite NEGATIVE NEGATIVE   Leukocytes, UA NEGATIVE NEGATIVE  Urine microscopic-add on     Status: Abnormal   Collection Time: 08/06/16  6:12 PM  Result Value Ref Range   Squamous Epithelial / LPF 0-5 (A) NONE SEEN   WBC, UA 0-5 0 - 5 WBC/hpf   RBC / HPF 0-5 0 - 5 RBC/hpf   Bacteria, UA FEW (A) NONE SEEN   Urine-Other MUCOUS PRESENT   Pregnancy, urine POC     Status: Abnormal   Collection Time: 08/06/16  6:16 PM  Result Value Ref Range   Preg Test, Ur POSITIVE (A) NEGATIVE  CBC     Status: None   Collection Time: 08/06/16  6:31 PM  Result Value Ref Range   WBC 5.1 4.0 - 10.5 K/uL   RBC 4.65 3.87 - 5.11 MIL/uL   Hemoglobin 12.2 12.0 - 15.0 g/dL   HCT 29.536.5 62.136.0 - 30.846.0 %   MCV 78.5 78.0 - 100.0 fL   MCH 26.2 26.0 - 34.0 pg   MCHC 33.4 30.0 - 36.0 g/dL   RDW 65.713.9 84.611.5 - 96.215.5 %   Platelets 288 150 - 400 K/uL  ABO/Rh     Status: None   Collection Time: 08/06/16  6:31 PM  Result Value Ref Range   ABO/RH(D) O POS   hCG, quantitative, pregnancy     Status: Abnormal   Collection Time: 08/06/16  6:31 PM  Result Value Ref Range   hCG, Beta Chain, Quant, S 269 (H) <5 mIU/mL  Wet prep, genital     Status: Abnormal   Collection Time: 08/06/16  6:48 PM  Result Value Ref Range   Yeast Wet Prep HPF POC NONE SEEN NONE SEEN   Trich, Wet Prep NONE SEEN NONE SEEN   Clue Cells Wet Prep HPF POC NONE SEEN NONE SEEN   WBC, Wet Prep HPF POC FEW (A) NONE SEEN   Sperm NONE SEEN      MDM +UPT UA, wet prep, GC/chlamydia, CBC, ABO/Rh, quant hCG, HIV, and US today to rule out ectopic pregnancy O positive Patient in ultrasound. [facility]      Judeth HornErin Lawrence, NP 08/06/2016 8:05 PM   Assessment and Plan  Patient Pura SpiceRaina Maisie Cross is a 25 year old G2P1001 here with complaints of abdominal pain  and diarrhea. US showed a possible intrauterine gestational sac; adnexa normal. Probably corpus luteum in left ovary. Patient currently feeling better; has had no diarrhea since she has been here. Patient stable for discharge. WIll discharge with instructions on hydration and diet; patient to follow up for prenatal care with her ob-gyn provider. Plan of care discussed with Dr. Estanislado Pandy (covering attending) who agrees.

## 2016-08-06 NOTE — MAU Note (Signed)
Having some cramping.  +HPT on 11/20.  Has not seen dr yet.  Has hx of sab.  Doesn't know how far along she is.  Hx of genital herpes, currently has outbreak.  Has been having diarrhea since Tues, only one episode today

## 2016-08-10 LAB — GC/CHLAMYDIA PROBE AMP (~~LOC~~) NOT AT ARMC
Chlamydia: NEGATIVE
Neisseria Gonorrhea: NEGATIVE

## 2016-08-23 ENCOUNTER — Other Ambulatory Visit (HOSPITAL_COMMUNITY): Payer: Self-pay | Admitting: Obstetrics and Gynecology

## 2016-08-23 DIAGNOSIS — O3680X0 Pregnancy with inconclusive fetal viability, not applicable or unspecified: Secondary | ICD-10-CM

## 2016-08-31 ENCOUNTER — Ambulatory Visit (HOSPITAL_COMMUNITY): Payer: Self-pay

## 2016-09-09 ENCOUNTER — Encounter (HOSPITAL_COMMUNITY): Payer: Self-pay | Admitting: *Deleted

## 2016-09-09 ENCOUNTER — Inpatient Hospital Stay (HOSPITAL_COMMUNITY)
Admission: AD | Admit: 2016-09-09 | Discharge: 2016-09-09 | Disposition: A | Discharge status: Home / Self Care | Payer: Federal, State, Local not specified - PPO | Source: Ambulatory Visit | Attending: Obstetrics and Gynecology | Admitting: Obstetrics and Gynecology

## 2016-09-09 DIAGNOSIS — O4691 Antepartum hemorrhage, unspecified, first trimester: Secondary | ICD-10-CM

## 2016-09-09 DIAGNOSIS — O210 Mild hyperemesis gravidarum: Secondary | ICD-10-CM | POA: Insufficient documentation

## 2016-09-09 DIAGNOSIS — Z3A09 9 weeks gestation of pregnancy: Secondary | ICD-10-CM | POA: Diagnosis not present

## 2016-09-09 DIAGNOSIS — O209 Hemorrhage in early pregnancy, unspecified: Secondary | ICD-10-CM | POA: Insufficient documentation

## 2016-09-09 DIAGNOSIS — O219 Vomiting of pregnancy, unspecified: Secondary | ICD-10-CM | POA: Diagnosis not present

## 2016-09-09 DIAGNOSIS — O4690 Antepartum hemorrhage, unspecified, unspecified trimester: Secondary | ICD-10-CM

## 2016-09-09 LAB — URINALYSIS, ROUTINE W REFLEX MICROSCOPIC
BILIRUBIN URINE: NEGATIVE
GLUCOSE, UA: NEGATIVE mg/dL
HGB URINE DIPSTICK: NEGATIVE
Ketones, ur: NEGATIVE mg/dL
NITRITE: NEGATIVE
Protein, ur: NEGATIVE mg/dL
SPECIFIC GRAVITY, URINE: 1.021 (ref 1.005–1.030)
pH: 7 (ref 5.0–8.0)

## 2016-09-09 LAB — CBC
HEMATOCRIT: 36.8 % (ref 36.0–46.0)
Hemoglobin: 12.7 g/dL (ref 12.0–15.0)
MCH: 26.8 pg (ref 26.0–34.0)
MCHC: 34.5 g/dL (ref 30.0–36.0)
MCV: 77.6 fL — AB (ref 78.0–100.0)
Platelets: 315 10*3/uL (ref 150–400)
RBC: 4.74 MIL/uL (ref 3.87–5.11)
RDW: 13.3 % (ref 11.5–15.5)
WBC: 5.8 10*3/uL (ref 4.0–10.5)

## 2016-09-09 LAB — HCG, QUANTITATIVE, PREGNANCY: HCG, BETA CHAIN, QUANT, S: 172368 m[IU]/mL — AB (ref <5)

## 2016-09-09 MED ORDER — PROMETHAZINE HCL 25 MG/ML IJ SOLN
25.0000 mg | Freq: Once | INTRAVENOUS | Status: AC
  Administered 2016-09-09: 25 mg via INTRAVENOUS
  Filled 2016-09-09: qty 1

## 2016-09-09 MED ORDER — PROMETHAZINE HCL 25 MG PO TABS: 25.0000 mg | ORAL_TABLET | Freq: Four times a day (QID) | ORAL | 1 refills | Status: DC | PRN

## 2016-09-09 NOTE — Discharge Instructions (Signed)
Morning Sickness °Morning sickness is when you feel sick to your stomach (nauseous) during pregnancy. This nauseous feeling may or may not come with vomiting. It often occurs in the morning but can be a problem any time of day. Morning sickness is most common during the first trimester, but it may continue throughout pregnancy. While morning sickness is unpleasant, it is usually harmless unless you develop severe and continual vomiting (hyperemesis gravidarum). This condition requires more intense treatment. °What are the causes? °The cause of morning sickness is not completely known but seems to be related to normal hormonal changes that occur in pregnancy. °What increases the risk? °You are at greater risk if you: °· Experienced nausea or vomiting before your pregnancy. °· Had morning sickness during a previous pregnancy. °· Are pregnant with more than one baby, such as twins. °How is this treated? °Do not use any medicines (prescription, over-the-counter, or herbal) for morning sickness without first talking to your health care provider. Your health care provider may prescribe or recommend: °· Vitamin B6 supplements. °· Anti-nausea medicines. °· The herbal medicine ginger. °Follow these instructions at home: °· Only take over-the-counter or prescription medicines as directed by your health care provider. °· Taking multivitamins before getting pregnant can prevent or decrease the severity of morning sickness in most women. °· Eat a piece of dry toast or unsalted crackers before getting out of bed in the morning. °· Eat five or six small meals a day. °· Eat dry and bland foods (rice, baked potato). Foods high in carbohydrates are often helpful. °· Do not drink liquids with your meals. Drink liquids between meals. °· Avoid greasy, fatty, and spicy foods. °· Get someone to cook for you if the smell of any food causes nausea and vomiting. °· If you feel nauseous after taking prenatal vitamins, take the vitamins at  night or with a snack. °· Snack on protein foods (nuts, yogurt, cheese) between meals if you are hungry. °· Eat unsweetened gelatins for desserts. °· Wearing an acupressure wristband (worn for sea sickness) may be helpful. °· Acupuncture may be helpful. °· Do not smoke. °· Get a humidifier to keep the air in your house free of odors. °· Get plenty of fresh air. °Contact a health care provider if: °· Your home remedies are not working, and you need medicine. °· You feel dizzy or lightheaded. °· You are losing weight. °Get help right away if: °· You have persistent and uncontrolled nausea and vomiting. °· You pass out (faint). °This information is not intended to replace advice given to you by your health care provider. Make sure you discuss any questions you have with your health care provider. °Document Released: 10/20/2006 Document Revised: 02/04/2016 Document Reviewed: 02/13/2013 °Elsevier Interactive Patient Education © 2017 Elsevier Inc. °Vaginal Bleeding During Pregnancy, First Trimester °A small amount of bleeding (spotting) from the vagina is relatively common in early pregnancy. It usually stops on its own. Various things may cause bleeding or spotting in early pregnancy. Some bleeding may be related to the pregnancy, and some may not. In most cases, the bleeding is normal and is not a problem. However, bleeding can also be a sign of something serious. Be sure to tell your health care provider about any vaginal bleeding right away. °Some possible causes of vaginal bleeding during the first trimester include: °· Infection or inflammation of the cervix. °· Growths (polyps) on the cervix. °· Miscarriage or threatened miscarriage. °· Pregnancy tissue has developed outside of the uterus and   in a fallopian tube (tubal pregnancy). °· Tiny cysts have developed in the uterus instead of pregnancy tissue (molar pregnancy). °Follow these instructions at home: °Watch your condition for any changes. The following actions  may help to lessen any discomfort you are feeling: °· Follow your health care provider's instructions for limiting your activity. If your health care provider orders bed rest, you may need to stay in bed and only get up to use the bathroom. However, your health care provider may allow you to continue light activity. °· If needed, make plans for someone to help with your regular activities and responsibilities while you are on bed rest. °· Keep track of the number of pads you use each day, how often you change pads, and how soaked (saturated) they are. Write this down. °· Do not use tampons. Do not douche. °· Do not have sexual intercourse or orgasms until approved by your health care provider. °· If you pass any tissue from your vagina, save the tissue so you can show it to your health care provider. °· Only take over-the-counter or prescription medicines as directed by your health care provider. °· Do not take aspirin because it can make you bleed. °· Keep all follow-up appointments as directed by your health care provider. °Contact a health care provider if: °· You have any vaginal bleeding during any part of your pregnancy. °· You have cramps or labor pains. °· You have a fever, not controlled by medicine. °Get help right away if: °· You have severe cramps in your back or belly (abdomen). °· You pass large clots or tissue from your vagina. °· Your bleeding increases. °· You feel light-headed or weak, or you have fainting episodes. °· You have chills. °· You are leaking fluid or have a gush of fluid from your vagina. °· You pass out while having a bowel movement. °This information is not intended to replace advice given to you by your health care provider. Make sure you discuss any questions you have with your health care provider. °Document Released: 06/08/2005 Document Revised: 02/04/2016 Document Reviewed: 05/06/2013 °Elsevier Interactive Patient Education © 2017 Elsevier Inc. ° °

## 2016-09-09 NOTE — MAU Note (Signed)
Pt presents to MAU with complaints of vaginal bleeding with lower abdominal cramping that started last night.

## 2016-09-09 NOTE — MAU Provider Note (Signed)
Chief Complaint: Vaginal Bleeding and Abdominal Cramping   First Provider Initiated Contact with Patient 09/09/16 1414     SUBJECTIVE HPI: Susan Cross is a 25 y.o. G2P0010 at 102w4d by LMP and office [place] at [facility] who presents to Maternity Admissions reporting vaginal bleeding since yesterday, abd cramping throughout the pregnancy and exacerbation of nausea and vomiting of pregnancy. Has been taking Unysom w/ mild improvement, but it interfered w/ sleep schedule so she stopped.   Live IUP confirmed on [place] 08/29/16 at [facility]. Confirmed EDD by LMP.   Blood type O pos.   Neg GC/Chlamydia cultures and wet prep 08/06/16 in MAU. She states she is in a mutually monogamous relationship x 2 years. Last IC 2 weeks ago.   Abd Pain Location: suprapubic Quality: cramping Severity: 8/10 on pain scale Duration: 5 weeks Context: early pregnancy, IUP previously confirmed Timing: intermittent Modifying factors: hasn't tried anything for pain.  Associated signs and symptoms: pos for vaginal bleeding, N/V. Neg for fever, chills, diarrhea, constipation, urinary complaints, vaginal discharge or sick contacts.  Past Medical History:  Diagnosis Date  . Anemia   . Genital herpes    OB History  Gravida Para Term Preterm AB Living  2       1    SAB TAB Ectopic Multiple Live Births  1            # Outcome Date GA Lbr Len/2nd Weight Sex Delivery Anes PTL Lv  2 Current           1 SAB              Past Surgical History:  Procedure Laterality Date  . NO PAST SURGERIES     Social History   Social History  . Marital status: Legally Separated    Spouse name: N/A  . Number of children: N/A  . Years of education: N/A   Occupational History  . Not on file.   Social History Main Topics  . Smoking status: Never Smoker  . Smokeless tobacco: Never Used  . Alcohol use Yes     Comment: occiasionally not since pregnancy  . Drug use: No  . Sexual activity: Yes    Birth control/ protection:  None   Other Topics Concern  . Not on file   Social History Narrative  . No narrative on file   History reviewed. No pertinent family history. No current facility-administered medications on file prior to encounter.    No current outpatient prescriptions on file prior to encounter.   No Known Allergies  I have reviewed patient's Past Medical Hx, Surgical Hx, Family Hx, Social Hx, medications and allergies.   Review of Systems  Constitutional: Negative for chills and fever.  Gastrointestinal: Positive for abdominal pain, nausea and vomiting. Negative for constipation and diarrhea.  Genitourinary: Positive for vaginal bleeding. Negative for dysuria, frequency and vaginal discharge.       Positive for herpes outbreak  Musculoskeletal: Negative for back pain.  Neurological: Negative for dizziness.    OBJECTIVE Patient Vitals for the past 24 hrs:  BP Temp Pulse Resp Height Weight  09/09/16 1250 113/63 98.5 F (36.9 C) 81 18 5\' 2"  (1.575 m) 146 lb (66.2 kg)   Constitutional: Well-developed, well-nourished female in no acute distress. Vomiting.  Head: Mucus membranes moist Cardiovascular: normal rate Respiratory: normal rate and effort.  GI: Abd soft, non-tender. Pos BS x 4 MS: Extremities nontender, no edema, normal ROM Neurologic: Alert and oriented x 4.  GU: Neg CVAT.  SPECULUM EXAM: NEFG, physiologic discharge, no blood noted, cervix w/ 2 cm ectropion, friable. No mucopurulent discharge.   BIMANUAL: cervix long and closed; uterus 9-10 week size, no adnexal tenderness or masses. No CMT.  LAB RESULTS Results for orders placed or performed during the hospital encounter of 09/09/16 (from the past 24 hour(s))  hCG, quantitative, pregnancy     Status: Abnormal   Collection Time: 09/09/16  1:42 PM  Result Value Ref Range   hCG, Beta Chain, Quant, S 172,368 (H) <5 mIU/mL  CBC     Status: Abnormal   Collection Time: 09/09/16  1:42 PM  Result Value Ref Range   WBC 5.8 4.0 -  10.5 K/uL   RBC 4.74 3.87 - 5.11 MIL/uL   Hemoglobin 12.7 12.0 - 15.0 g/dL   HCT 08.636.8 57.836.0 - 46.946.0 %   MCV 77.6 (L) 78.0 - 100.0 fL   MCH 26.8 26.0 - 34.0 pg   MCHC 34.5 30.0 - 36.0 g/dL   RDW 62.913.3 52.811.5 - 41.315.5 %   Platelets 315 150 - 400 K/uL  Urinalysis, Routine w reflex microscopic     Status: Abnormal   Collection Time: 09/09/16  1:50 PM  Result Value Ref Range   Color, Urine YELLOW YELLOW   APPearance CLOUDY (A) CLEAR   Specific Gravity, Urine 1.021 1.005 - 1.030   pH 7.0 5.0 - 8.0   Glucose, UA NEGATIVE NEGATIVE mg/dL   Hgb urine dipstick NEGATIVE NEGATIVE   Bilirubin Urine NEGATIVE NEGATIVE   Ketones, ur NEGATIVE NEGATIVE mg/dL   Protein, ur NEGATIVE NEGATIVE mg/dL   Nitrite NEGATIVE NEGATIVE   Leukocytes, UA MODERATE (A) NEGATIVE   RBC / HPF 0-5 0 - 5 RBC/hpf   WBC, UA 0-5 0 - 5 WBC/hpf   Bacteria, UA RARE (A) NONE SEEN   Squamous Epithelial / LPF 6-30 (A) NONE SEEN   Mucous PRESENT     IMAGING Informal BS US shows 9-1 week live SIUP, FHR 167, positive fetal mvmt.   MAU COURSE Orders Placed This Encounter  Procedures  . Urinalysis, Routine w reflex microscopic  . hCG, quantitative, pregnancy  . CBC   Meds ordered this encounter  Medications  . Prenatal Vit-Fe Fumarate-FA (PRENATAL MULTIVITAMIN) TABS tablet    Sig: Take 1 tablet by mouth daily at 12 noon.  . promethazine (PHENERGAN) 25 mg in lactated ringers 1,000 mL infusion  . promethazine (PHENERGAN) 25 MG tablet    Sig: Take 1 tablet (25 mg total) by mouth every 6 (six) hours as needed for nausea or vomiting.    Dispense:  30 tablet    Refill:  1    Order Specific Question:   Supervising Provider    Answer:   Levie HeritageSTINSON, JACOB J [[id number]]   Nausea and vomiting resolved w/ phenergan. Tolerating PO's.   Discussed Hx, exam, labs w/ Dr. Charlotta Newtonzan. Agrees w/ POC.  MDM - Reported vaginal bleeding, but none on exam. Suspect bleeding from friable cervix. Blood Type O pos. Live IUP confirmed. - N/V of pregnancy  controlled w/ phenergan. Will D/C hoem w/ PO phenergan. - Ongoing cramping throughout pregnancy. Cervix long and closed. No evidence of infection or SAB in progress. Possible round ligament pains.   ASSESSMENT 1. Vaginal bleeding in pregnancy   2. Nausea/vomiting in pregnancy     PLAN Discharge home in stable condition per consult w/ Dr. Charlotta Newtonzan. Bleeding and N/V precautions Pelvic rest x 1 week. Comfort measures.  Follow-up Information  [facility] Follow up on 09/16/2016.   Specialty:  Obstetrics and Gynecology Why:  as scheduled Contact information: [address] [place] [address] [phone]        [facility] Follow up.   Why:  as needed in emergencies Contact information: [address] [id number] [place] [id number] [phone]         Allergies as of 09/09/2016   No Known Allergies     Medication List    TAKE these medications   prenatal multivitamin Tabs tablet Take 1 tablet by mouth daily at 12 noon.   promethazine 25 MG tablet Commonly known as:  PHENERGAN Take 1 tablet (25 mg total) by mouth every 6 (six) hours as needed for nausea or vomiting.        [place] Ludy [place], [place] 09/09/2016  3:19 PM

## 2016-09-19 ENCOUNTER — Other Ambulatory Visit: Payer: Self-pay | Admitting: Obstetrics and Gynecology

## 2016-09-19 ENCOUNTER — Other Ambulatory Visit (HOSPITAL_COMMUNITY)
Admission: RE | Admit: 2016-09-19 | Discharge: 2016-09-19 | Disposition: A | Discharge status: Home / Self Care | Payer: Self-pay | Source: Ambulatory Visit | Attending: Obstetrics and Gynecology | Admitting: Obstetrics and Gynecology

## 2016-09-19 DIAGNOSIS — Z113 Encounter for screening for infections with a predominantly sexual mode of transmission: Secondary | ICD-10-CM | POA: Insufficient documentation

## 2016-09-19 DIAGNOSIS — Z01419 Encounter for gynecological examination (general) (routine) without abnormal findings: Secondary | ICD-10-CM | POA: Insufficient documentation

## 2016-09-22 LAB — CYTOLOGY - PAP
Chlamydia: NEGATIVE
DIAGNOSIS: NEGATIVE
Neisseria Gonorrhea: NEGATIVE

## 2016-10-15 ENCOUNTER — Encounter (HOSPITAL_COMMUNITY): Payer: Self-pay | Admitting: *Deleted

## 2016-10-15 ENCOUNTER — Inpatient Hospital Stay (HOSPITAL_COMMUNITY)
Admission: AD | Admit: 2016-10-15 | Discharge: 2016-10-16 | Disposition: A | Discharge status: Home / Self Care | Payer: Federal, State, Local not specified - PPO | Source: Ambulatory Visit | Attending: Obstetrics and Gynecology | Admitting: Obstetrics and Gynecology

## 2016-10-15 DIAGNOSIS — Z3A14 14 weeks gestation of pregnancy: Secondary | ICD-10-CM | POA: Diagnosis not present

## 2016-10-15 DIAGNOSIS — O26892 Other specified pregnancy related conditions, second trimester: Secondary | ICD-10-CM | POA: Insufficient documentation

## 2016-10-15 DIAGNOSIS — R52 Pain, unspecified: Secondary | ICD-10-CM | POA: Diagnosis not present

## 2016-10-15 DIAGNOSIS — J029 Acute pharyngitis, unspecified: Secondary | ICD-10-CM | POA: Insufficient documentation

## 2016-10-15 DIAGNOSIS — R1084 Generalized abdominal pain: Secondary | ICD-10-CM | POA: Diagnosis not present

## 2016-10-15 LAB — URINALYSIS, ROUTINE W REFLEX MICROSCOPIC
Bilirubin Urine: NEGATIVE
GLUCOSE, UA: NEGATIVE mg/dL
HGB URINE DIPSTICK: NEGATIVE
KETONES UR: NEGATIVE mg/dL
NITRITE: NEGATIVE
PH: 7 (ref 5.0–8.0)
Protein, ur: NEGATIVE mg/dL
Specific Gravity, Urine: 1.009 (ref 1.005–1.030)

## 2016-10-15 LAB — CBC WITH DIFFERENTIAL/PLATELET
Basophils Absolute: 0 10*3/uL (ref 0.0–0.1)
Basophils Relative: 0 %
Eosinophils Absolute: 0.1 10*3/uL (ref 0.0–0.7)
Eosinophils Relative: 0 %
HCT: 30.5 % — ABNORMAL LOW (ref 36.0–46.0)
HEMOGLOBIN: 10.8 g/dL — AB (ref 12.0–15.0)
LYMPHS ABS: 1.9 10*3/uL (ref 0.7–4.0)
Lymphocytes Relative: 16 %
MCH: 26.9 pg (ref 26.0–34.0)
MCHC: 35.4 g/dL (ref 30.0–36.0)
MCV: 76.1 fL — ABNORMAL LOW (ref 78.0–100.0)
MONOS PCT: 5 %
Monocytes Absolute: 0.6 10*3/uL (ref 0.1–1.0)
NEUTROS PCT: 79 %
Neutro Abs: 9.5 10*3/uL — ABNORMAL HIGH (ref 1.7–7.7)
Platelets: 250 10*3/uL (ref 150–400)
RBC: 4.01 MIL/uL (ref 3.87–5.11)
RDW: 13.2 % (ref 11.5–15.5)
WBC: 12.1 10*3/uL — AB (ref 4.0–10.5)

## 2016-10-15 LAB — INFLUENZA PANEL BY PCR (TYPE A & B)
Influenza A By PCR: NEGATIVE
Influenza B By PCR: NEGATIVE

## 2016-10-15 MED ORDER — IBUPROFEN 600 MG PO TABS
600.0000 mg | ORAL_TABLET | Freq: Once | ORAL | Status: AC
  Administered 2016-10-15: 600 mg via ORAL
  Filled 2016-10-15: qty 1

## 2016-10-15 NOTE — MAU Note (Signed)
Body aches all day. Chills but did not take temp. Back pain from lower back to neck. Sore throat. Vomited x 2 which is not new and had diarrhea x 1 which is not unusual

## 2016-10-15 NOTE — Discharge Instructions (Signed)
Sore Throat When you have a sore throat, your throat may:  Hurt.  Burn.  Feel irritated.  Feel scratchy. Many things can cause a sore throat, including:  An infection.  Allergies.  Dryness in the air.  Smoke or pollution.  Gastroesophageal reflux disease (GERD).  A tumor. A sore throat can be the first sign of another sickness. It can happen with other problems, like coughing or a fever. Most sore throats go away without treatment. Follow these instructions at home:  Take over-the-counter medicines only as told by your doctor.  Drink enough fluids to keep your pee (urine) clear or pale yellow.  Rest when you feel you need to.  To help with pain, try:  Sipping warm liquids, such as broth, herbal tea, or warm water.  Eating or drinking cold or frozen liquids, such as frozen ice pops.  Gargling with a salt-water mixture 3-4 times a day or as needed. To make a salt-water mixture, add -1 tsp of salt in 1 cup of warm water. Mix it until you cannot see the salt anymore.  Sucking on hard candy or throat lozenges.  Putting a cool-mist humidifier in your bedroom at night.  Sitting in the bathroom with the door closed for 5-10 minutes while you run hot water in the shower.  Do not use any tobacco products, such as cigarettes, chewing tobacco, and e-cigarettes. If you need help quitting, ask your doctor. Contact a doctor if:  You have a fever for more than 2-3 days.  You keep having symptoms for more than 2-3 days.  Your throat does not get better in 7 days.  You have a fever and your symptoms suddenly get worse. Get help right away if:  You have trouble breathing.  You cannot swallow fluids, soft foods, or your saliva.  You have swelling in your throat or neck that gets worse.  You keep feeling like you are going to throw up (vomit).  You keep throwing up. This information is not intended to replace advice given to you by your health care provider. Make sure  you discuss any questions you have with your health care provider. Document Released: 06/07/2008 Document Revised: 04/24/2016 Document Reviewed: 06/19/2015 Elsevier Interactive Patient Education  2017 Elsevier Inc.  

## 2016-10-15 NOTE — MAU Provider Note (Signed)
History     CSN: [id number]  Arrival date and time: 10/15/16 2210   First Provider Initiated Contact with Patient 10/15/16 2252      Chief Complaint  Patient presents with  . Generalized Body Aches  . Back Pain   HPI   Susan Cross is a 26 y.o. female G2P0010 @ 4059w5d here in MAU with aches, and sore throat. Symptoms started yesterday. No sick contacts. No fever. Did not speak to the office before coming in. The aches are all over her body. She has no urinary complaints at this time.   OB History    Gravida Para Term Preterm AB Living   2       1     SAB TAB Ectopic Multiple Live Births   1              Past Medical History:  Diagnosis Date  . Anemia   . Genital herpes   . Medical history non-contributory     Past Surgical History:  Procedure Laterality Date  . NO PAST SURGERIES      Family History  Problem Relation Age of Onset  . Heart disease Father   . Cancer Maternal Aunt   . Cancer Maternal Grandmother     Social History  Substance Use Topics  . Smoking status: Never Smoker  . Smokeless tobacco: Never Used  . Alcohol use Yes     Comment: occiasionally not since pregnancy    Allergies: No Known Allergies  Prescriptions Prior to Admission  Medication Sig Dispense Refill Last Dose  . promethazine (PHENERGAN) 25 MG tablet Take 1 tablet (25 mg total) by mouth every 6 (six) hours as needed for nausea or vomiting. 30 tablet 1 Past Week at Unknown time  . Prenatal Vit-Fe Fumarate-FA (PRENATAL MULTIVITAMIN) TABS tablet Take 1 tablet by mouth daily at 12 noon.   Past Week at Unknown time  . valACYclovir (VALTREX) 1000 MG tablet Take 1,000 mg by mouth 2 (two) times daily.   More than a month at Unknown time   Results for orders placed or performed during the hospital encounter of 10/15/16 (from the past 48 hour(s))  Urinalysis, Routine w reflex microscopic     Status: Abnormal   Collection Time: 10/15/16 10:40 PM  Result Value Ref Range   Color, Urine  STRAW (A) YELLOW   APPearance HAZY (A) CLEAR   Specific Gravity, Urine 1.009 1.005 - 1.030   pH 7.0 5.0 - 8.0   Glucose, UA NEGATIVE NEGATIVE mg/dL   Hgb urine dipstick NEGATIVE NEGATIVE   Bilirubin Urine NEGATIVE NEGATIVE   Ketones, ur NEGATIVE NEGATIVE mg/dL   Protein, ur NEGATIVE NEGATIVE mg/dL   Nitrite NEGATIVE NEGATIVE   Leukocytes, UA SMALL (A) NEGATIVE   RBC / HPF 0-5 0 - 5 RBC/hpf   WBC, UA 0-5 0 - 5 WBC/hpf   Bacteria, UA FEW (A) NONE SEEN   Squamous Epithelial / LPF 6-30 (A) NONE SEEN   Mucous PRESENT   Rapid strep screen     Status: None   Collection Time: 10/15/16 10:50 PM  Result Value Ref Range   Streptococcus, Group A Screen (Direct) NEGATIVE NEGATIVE    Comment: PERFORMED AT [facility] (NOTE) A Rapid Antigen test may result negative if the antigen level in the sample is below the detection level of this test. The FDA has not cleared this test as a stand-alone test therefore the rapid antigen negative result has reflexed to a Group A Strep  culture.   Influenza panel by PCR (type A & B)     Status: None   Collection Time: 10/15/16 10:50 PM  Result Value Ref Range   Influenza A By PCR NEGATIVE NEGATIVE   Influenza B By PCR NEGATIVE NEGATIVE    Comment: (NOTE) The Xpert Xpress Flu assay is intended as an aid in the diagnosis of  influenza and should not be used as a sole basis for treatment.  This  assay is FDA approved for nasopharyngeal swab specimens only. Nasal  washings and aspirates are unacceptable for Xpert Xpress Flu testing.   CBC with Differential     Status: Abnormal   Collection Time: 10/15/16 11:20 PM  Result Value Ref Range   WBC 12.1 (H) 4.0 - 10.5 K/uL   RBC 4.01 3.87 - 5.11 MIL/uL   Hemoglobin 10.8 (L) 12.0 - 15.0 g/dL   HCT 11.9 (L) 14.7 - 82.9 %   MCV 76.1 (L) 78.0 - 100.0 fL   MCH 26.9 26.0 - 34.0 pg   MCHC 35.4 30.0 - 36.0 g/dL   RDW 56.2 13.0 - 86.5 %   Platelets 250 150 - 400 K/uL   Neutrophils Relative % 79 %   Neutro  Abs 9.5 (H) 1.7 - 7.7 K/uL   Lymphocytes Relative 16 %   Lymphs Abs 1.9 0.7 - 4.0 K/uL   Monocytes Relative 5 %   Monocytes Absolute 0.6 0.1 - 1.0 K/uL   Eosinophils Relative 0 %   Eosinophils Absolute 0.1 0.0 - 0.7 K/uL   Basophils Relative 0 %   Basophils Absolute 0.0 0.0 - 0.1 K/uL    Review of Systems  Constitutional: Negative for fever.  HENT: Positive for sore throat.   Respiratory: Negative for cough.    Physical Exam   Blood pressure 122/74, pulse 106, temperature 98.1 F (36.7 C), resp. rate 18, height 5\' 1"  (1.549 m), weight 154 lb (69.9 kg), last menstrual period 07/04/2016.  Physical Exam  Constitutional: She is oriented to person, place, and time. She appears well-developed and well-nourished.  Non-toxic appearance. She has a sickly appearance. She does not appear ill. No distress.  HENT:  Mouth/Throat: Mucous membranes are normal. Posterior oropharyngeal erythema present. No oropharyngeal exudate, posterior oropharyngeal edema or tonsillar abscesses.  Cardiovascular: Normal rate.   Respiratory: Effort normal and breath sounds normal. No respiratory distress.  Musculoskeletal: Normal range of motion.  Neurological: She is alert and oriented to person, place, and time.  Skin: Skin is warm. She is not diaphoretic.  Psychiatric: Her behavior is normal.    MAU Course  Procedures  None  MDM  Influenza & strep swabs collected Ibuprofen 600 mg PO CBC with diff.  Discussed patient with Dr. Sallye Ober   Assessment and Plan    A:  1. Body aches   2. Sore throat     P:  Discharge home in stable condition Swabs pending at the time of discharge, patient is without fever. If swabs are positive I will call the patient with the results, if negative the patient will not get a call.  Ok to use Ibuprofen as needed, as directed on the bottle. Minimal use encouraged Ok to use tylenol as directed on the bottle.  PO hydration Return to MAU for OB emergencies  Duane Lope, NP 10/16/2016 1:11 AM

## 2016-10-16 LAB — RAPID STREP SCREEN (MED CTR MEBANE ONLY): Streptococcus, Group A Screen (Direct): NEGATIVE

## 2016-10-16 NOTE — Progress Notes (Signed)
Written and verbal d/c instructions given and understanding voiced. 

## 2016-10-18 LAB — CULTURE, OB URINE
Culture: 1000 — AB
SPECIAL REQUESTS: NORMAL

## 2016-10-18 LAB — CULTURE, GROUP A STREP (THRC)

## 2016-11-07 ENCOUNTER — Encounter (HOSPITAL_COMMUNITY): Payer: Self-pay | Admitting: *Deleted

## 2016-11-07 ENCOUNTER — Inpatient Hospital Stay (HOSPITAL_COMMUNITY)
Admission: AD | Admit: 2016-11-07 | Discharge: 2016-11-07 | Disposition: A | Discharge status: Home / Self Care | Payer: Federal, State, Local not specified - PPO | Source: Ambulatory Visit | Attending: Obstetrics and Gynecology | Admitting: Obstetrics and Gynecology

## 2016-11-07 DIAGNOSIS — R1030 Lower abdominal pain, unspecified: Secondary | ICD-10-CM | POA: Diagnosis present

## 2016-11-07 DIAGNOSIS — O2342 Unspecified infection of urinary tract in pregnancy, second trimester: Secondary | ICD-10-CM

## 2016-11-07 DIAGNOSIS — Z3A18 18 weeks gestation of pregnancy: Secondary | ICD-10-CM | POA: Insufficient documentation

## 2016-11-07 HISTORY — DX: Unspecified infectious disease: B99.9

## 2016-11-07 LAB — URINALYSIS, ROUTINE W REFLEX MICROSCOPIC
BILIRUBIN URINE: NEGATIVE
Glucose, UA: 50 mg/dL — AB
Hgb urine dipstick: NEGATIVE
Ketones, ur: NEGATIVE mg/dL
Nitrite: NEGATIVE
PH: 6 (ref 5.0–8.0)
Protein, ur: NEGATIVE mg/dL
SPECIFIC GRAVITY, URINE: 1.02 (ref 1.005–1.030)

## 2016-11-07 MED ORDER — NITROFURANTOIN MONOHYD MACRO 100 MG PO CAPS: 100.0000 mg | ORAL_CAPSULE | Freq: Two times a day (BID) | ORAL | 0 refills | Status: DC

## 2016-11-07 NOTE — MAU Note (Signed)
Am bleeding in her pee, sees it when she wipes- started this morning.  Initially was pink, then started getting red..  Having suprapubic pain, started a couple days ago- wakes her out of her sleep, had called her dr then, thought ? Uterus expanding, when started bleeding was told to come in.. Frequency and urgency, spasms, and pressure.

## 2016-11-07 NOTE — MAU Note (Signed)
Bleeding first noted when strained with BM

## 2016-11-07 NOTE — MAU Provider Note (Signed)
Chief Complaint: No chief complaint on file.   First Provider Initiated Contact with Patient 11/07/16 1750      SUBJECTIVE HPI: Susan Cross is a 26 y.o. G2P0010 at [redacted]w[redacted]d by LMP who presents to maternity admissions reporting abdominal cramping that is low in front of pelvis x 2 days with onset of pink bleeding when wiping this morning. She reports she had a bowel movement before bleeding started but has continued to see pink bleeding all day today when wiping. Her abdominal cramping is intermittent and unchanged since onset 2 days ago.  She has not tried any treatments, nothing makes her symptoms better or worse.  She denies vaginal itching/burning, urinary symptoms, h/a, dizziness, n/v, or fever/chills.     HPI  Past Medical History:  Diagnosis Date  . Anemia   . Genital herpes   . Genital herpes    last was early in preg  . Infection    UTI  . Medical history non-contributory    Past Surgical History:  Procedure Laterality Date  . NO PAST SURGERIES     Social History   Social History  . Marital status: Divorced    Spouse name: N/A  . Number of children: N/A  . Years of education: N/A   Occupational History  . Not on file.   Social History Main Topics  . Smoking status: Never Smoker  . Smokeless tobacco: Never Used  . Alcohol use Yes     Comment: occiasionally not since pregnancy  . Drug use: No  . Sexual activity: Yes    Birth control/ protection: None   Other Topics Concern  . Not on file   Social History Narrative  . No narrative on file   No current facility-administered medications on file prior to encounter.    Current Outpatient Prescriptions on File Prior to Encounter  Medication Sig Dispense Refill  . promethazine (PHENERGAN) 25 MG tablet Take 1 tablet (25 mg total) by mouth every 6 (six) hours as needed for nausea or vomiting. 30 tablet 1   No Known Allergies  ROS:  Review of Systems  Constitutional: Negative for chills, fatigue and fever.   Respiratory: Negative for shortness of breath.   Cardiovascular: Negative for chest pain.  Gastrointestinal: Negative for nausea and vomiting.  Genitourinary: Positive for pelvic pain and vaginal bleeding. Negative for difficulty urinating, dysuria, flank pain, vaginal discharge and vaginal pain.  Neurological: Negative for dizziness and headaches.  Psychiatric/Behavioral: Negative.      I have reviewed patient's Past Medical Hx, Surgical Hx, Family Hx, Social Hx, medications and allergies.   Physical Exam  Patient Vitals for the past 24 hrs:  BP Temp Temp src Pulse Resp Weight  11/07/16 1809 117/73 - - 81 18 -  11/07/16 1549 122/72 98.3 F (36.8 C) Oral 91 16 157 lb 12 oz (71.6 kg)   Constitutional: Well-developed, well-nourished female in no acute distress.  Cardiovascular: normal rate Respiratory: normal effort GI: Abd soft, non-tender. Pos BS x 4 MS: Extremities nontender, no edema, normal ROM Neurologic: Alert and oriented x 4.  GU: Neg CVAT.  PELVIC EXAM: Cervix pink, visually closed, without lesion, scant white creamy discharge, no bleeding noted, vaginal walls and external genitalia normal Cervix closed/thick/high, no blood on glove   FHT 150 by doppler  LAB RESULTS Results for orders placed or performed during the hospital encounter of 11/07/16 (from the past 24 hour(s))  Urinalysis, Routine w reflex microscopic     Status: Abnormal   Collection Time:  11/07/16  4:17 PM  Result Value Ref Range   Color, Urine YELLOW YELLOW   APPearance CLOUDY (A) CLEAR   Specific Gravity, Urine 1.020 1.005 - 1.030   pH 6.0 5.0 - 8.0   Glucose, UA 50 (A) NEGATIVE mg/dL   Hgb urine dipstick NEGATIVE NEGATIVE   Bilirubin Urine NEGATIVE NEGATIVE   Ketones, ur NEGATIVE NEGATIVE mg/dL   Protein, ur NEGATIVE NEGATIVE mg/dL   Nitrite NEGATIVE NEGATIVE   Leukocytes, UA LARGE (A) NEGATIVE   RBC / HPF 0-5 0 - 5 RBC/hpf   WBC, UA TOO NUMEROUS TO COUNT 0 - 5 WBC/hpf   Bacteria, UA MANY  (A) NONE SEEN   Squamous Epithelial / LPF 6-30 (A) NONE SEEN   Mucous PRESENT     --/--/O POS (11/25 1831)  IMAGING No results found.  MAU Management/MDM: Ordered labs and reviewed results. No evidence of bleeding on exam, no evidence of preterm labor. Consult Dr Richardson Doppole.  Will treat for UTI with Macrobid BID x 7 days.  Bleeding precautions reviewed.  F/U in office, return to MAU as needed for emergencies.  Pt stable at time of discharge.  ASSESSMENT 1. Urinary tract infection in mother during second trimester of pregnancy     PLAN Discharge home with bleeding precautions  Allergies as of 11/07/2016   No Known Allergies     Medication List    TAKE these medications   cetirizine 10 MG tablet Commonly known as:  ZYRTEC Take 10 mg by mouth daily.   nitrofurantoin (macrocrystal-monohydrate) 100 MG capsule Commonly known as:  MACROBID Take 1 capsule (100 mg total) by mouth 2 (two) times daily.   promethazine 25 MG tablet Commonly known as:  PHENERGAN Take 1 tablet (25 mg total) by mouth every 6 (six) hours as needed for nausea or vomiting.      Follow-up Information    Jessee AversOLE,TARA J., MD Follow up.   Specialty:  Obstetrics and Gynecology Why:  As scheduled. Return to MAU as needed for emergencies. Contact information: [address] [place] [id number] [phone]           Sharen CounterLisa Leftwich-Kirby Certified Nurse-Midwife 11/07/2016  6:18 PM

## 2016-11-08 LAB — CULTURE, OB URINE

## 2016-11-15 ENCOUNTER — Inpatient Hospital Stay (HOSPITAL_COMMUNITY)
Admission: AD | Admit: 2016-11-15 | Discharge: 2016-11-15 | Disposition: A | Discharge status: Home / Self Care | Payer: Federal, State, Local not specified - PPO | Source: Ambulatory Visit | Attending: Obstetrics & Gynecology | Admitting: Obstetrics & Gynecology

## 2016-11-15 ENCOUNTER — Encounter (HOSPITAL_COMMUNITY): Payer: Self-pay

## 2016-11-15 DIAGNOSIS — O99612 Diseases of the digestive system complicating pregnancy, second trimester: Secondary | ICD-10-CM | POA: Insufficient documentation

## 2016-11-15 DIAGNOSIS — A059 Bacterial foodborne intoxication, unspecified: Secondary | ICD-10-CM | POA: Diagnosis not present

## 2016-11-15 DIAGNOSIS — Z3A19 19 weeks gestation of pregnancy: Secondary | ICD-10-CM | POA: Insufficient documentation

## 2016-11-15 DIAGNOSIS — K521 Toxic gastroenteritis and colitis: Secondary | ICD-10-CM

## 2016-11-15 DIAGNOSIS — Z36 Encounter for antenatal screening for chromosomal anomalies: Secondary | ICD-10-CM | POA: Diagnosis not present

## 2016-11-15 DIAGNOSIS — Z3402 Encounter for supervision of normal first pregnancy, second trimester: Secondary | ICD-10-CM | POA: Diagnosis not present

## 2016-11-15 DIAGNOSIS — O210 Mild hyperemesis gravidarum: Secondary | ICD-10-CM | POA: Diagnosis present

## 2016-11-15 LAB — CBC
HCT: 33.2 % — ABNORMAL LOW (ref 36.0–46.0)
HEMOGLOBIN: 11.5 g/dL — AB (ref 12.0–15.0)
MCH: 27 pg (ref 26.0–34.0)
MCHC: 34.6 g/dL (ref 30.0–36.0)
MCV: 77.9 fL — ABNORMAL LOW (ref 78.0–100.0)
PLATELETS: 265 10*3/uL (ref 150–400)
RBC: 4.26 MIL/uL (ref 3.87–5.11)
RDW: 14 % (ref 11.5–15.5)
WBC: 9.3 10*3/uL (ref 4.0–10.5)

## 2016-11-15 LAB — COMPREHENSIVE METABOLIC PANEL
ALBUMIN: 3.5 g/dL (ref 3.5–5.0)
ALK PHOS: 45 U/L (ref 38–126)
ALT: 31 U/L (ref 14–54)
AST: 27 U/L (ref 15–41)
Anion gap: 11 (ref 5–15)
BUN: 15 mg/dL (ref 6–20)
CALCIUM: 8.7 mg/dL — AB (ref 8.9–10.3)
CHLORIDE: 104 mmol/L (ref 101–111)
CO2: 20 mmol/L — ABNORMAL LOW (ref 22–32)
Creatinine, Ser: 0.4 mg/dL — ABNORMAL LOW (ref 0.44–1.00)
GFR calc Af Amer: >60 mL/min (ref >60)
GFR calc non Af Amer: >60 mL/min (ref >60)
GLUCOSE: 88 mg/dL (ref 65–99)
Potassium: 4 mmol/L (ref 3.5–5.1)
SODIUM: 135 mmol/L (ref 135–145)
Total Bilirubin: 0.7 mg/dL (ref 0.3–1.2)
Total Protein: 7.1 g/dL (ref 6.5–8.1)

## 2016-11-15 LAB — URINALYSIS, ROUTINE W REFLEX MICROSCOPIC
BILIRUBIN URINE: NEGATIVE
GLUCOSE, UA: NEGATIVE mg/dL
Hgb urine dipstick: NEGATIVE
KETONES UR: NEGATIVE mg/dL
Nitrite: NEGATIVE
PH: 5 (ref 5.0–8.0)
Protein, ur: 30 mg/dL — AB
Specific Gravity, Urine: 1.027 (ref 1.005–1.030)

## 2016-11-15 MED ORDER — DEXTROSE IN LACTATED RINGERS 5 % IV SOLN
25.0000 mg | Freq: Once | INTRAVENOUS | Status: DC
  Filled 2016-11-15: qty 1

## 2016-11-15 MED ORDER — PROMETHAZINE HCL 25 MG PO TABS: 12.5000 mg | ORAL_TABLET | Freq: Four times a day (QID) | ORAL | 0 refills | Status: DC | PRN

## 2016-11-15 MED ORDER — ONDANSETRON HCL 4 MG/2ML IJ SOLN: 4.0000 mg | Freq: Once | INTRAMUSCULAR | Status: DC

## 2016-11-15 MED ORDER — DEXTROSE 5 % IN LACTATED RINGERS IV BOLUS: 1000.0000 mL | Freq: Once | INTRAVENOUS | Status: DC

## 2016-11-15 MED ORDER — DEXTROSE IN LACTATED RINGERS 5 % IV SOLN
25.0000 mg | Freq: Once | INTRAVENOUS | Status: DC
  Administered 2016-11-15: 25 mg via INTRAVENOUS
  Filled 2016-11-15: qty 1

## 2016-11-15 NOTE — MAU Note (Signed)
Pt here with c/o nausea, vomiting, diarrhea after eating [employer] pizza buffet tonight. Started vomiting about an hour after eating, hasn't been able to stop.

## 2016-11-15 NOTE — Discharge Instructions (Signed)
Food Choices to Help Relieve Diarrhea, Adult °When you have diarrhea, the foods you eat and your eating habits are very important. Choosing the right foods and drinks can help: °· Relieve diarrhea. °· Replace lost fluids and nutrients. °· Prevent dehydration. °What general guidelines should I follow? °Relieving diarrhea  °· Choose foods with less than 2 g or .07 oz. of fiber per serving. °· Limit fats to less than 8 tsp (38 g or 1.34 oz.) a day. °· Avoid the following: °¨ Foods and beverages sweetened with high-fructose corn syrup, honey, or sugar alcohols such as xylitol, sorbitol, and mannitol. °¨ Foods that contain a lot of fat or sugar. °¨ Fried, greasy, or spicy foods. °¨ High-fiber grains, breads, and cereals. °¨ Raw fruits and vegetables. °· Eat foods that are rich in probiotics. These foods include dairy products such as yogurt and fermented milk products. They help increase healthy bacteria in the stomach and intestines (gastrointestinal tract, or GI tract). °· If you have lactose intolerance, avoid dairy products. These may make your diarrhea worse. °· Take medicine to help stop diarrhea (antidiarrheal medicine) only as told by your health care provider. °Replacing nutrients  °· Eat small meals or snacks every 3-4 hours. °· Eat bland foods, such as white rice, toast, or baked potato, until your diarrhea starts to get better. Gradually reintroduce nutrient-rich foods as tolerated or as told by your health care provider. This includes: °¨ Well-cooked protein foods. °¨ Peeled, seeded, and soft-cooked fruits and vegetables. °¨ Low-fat dairy products. °· Take vitamin and mineral supplements as told by your health care provider. °Preventing dehydration  ° °· Start by sipping water or a special solution to prevent dehydration (oral rehydration solution, ORS). Urine that is clear or pale yellow means that you are getting enough fluid. °· Try to drink at least 8-10 cups of fluid each day to help replace lost  fluids. °· You may add other liquids in addition to water, such as clear juice or decaffeinated sports drinks, as tolerated or as told by your health care provider. °· Avoid drinks with caffeine, such as coffee, tea, or soft drinks. °· Avoid alcohol. °What foods are recommended? °The items listed may not be a complete list. Talk with your health care provider about what dietary choices are best for you. °Grains  °White rice. White, French, or pita breads (fresh or toasted), including plain rolls, buns, or bagels. White pasta. Saltine, soda, or graham crackers. Pretzels. Low-fiber cereal. Cooked cereals made with water (such as cornmeal, farina, or cream cereals). Plain muffins. Matzo. Melba toast. Zwieback. °Vegetables  °Potatoes (without the skin). Most well-cooked and canned vegetables without skins or seeds. Tender lettuce. °Fruits  °Apple sauce. Fruits canned in juice. Cooked apricots, cherries, grapefruit, peaches, pears, or plums. Fresh bananas and cantaloupe. °Meats and other protein foods  °Baked or boiled chicken. Eggs. Tofu. Fish. Seafood. Smooth nut butters. Ground or well-cooked tender beef, ham, veal, lamb, pork, or poultry. °Dairy  °Plain yogurt, kefir, and unsweetened liquid yogurt. Lactose-free milk, buttermilk, skim milk, or soy milk. Low-fat or nonfat hard cheese. °Beverages  °Water. Low-calorie sports drinks. Fruit juices without pulp. Strained tomato and vegetable juices. Decaffeinated teas. Sugar-free beverages not sweetened with sugar alcohols. Oral rehydration solutions, if approved by your health care provider. °Seasoning and other foods  °Bouillon, broth, or soups made from recommended foods. °What foods are not recommended? °The items listed may not be a complete list. Talk with your health care provider about what dietary choices   are best for you. Grains  Whole grain, whole wheat, bran, or rye breads, rolls, pastas, and crackers. Wild or brown rice. Whole grain or bran cereals. Barley.  Oats and oatmeal. Corn tortillas or taco shells. Granola. Popcorn. Vegetables  Raw vegetables. Fried vegetables. Cabbage, broccoli, Brussels sprouts, artichokes, baked beans, beet greens, corn, kale, legumes, peas, sweet potatoes, and yams. Potato skins. Cooked spinach and cabbage. Fruits  Dried fruit, including raisins and dates. Raw fruits. Stewed or dried prunes. Canned fruits with syrup. Meat and other protein foods  Fried or fatty meats. Deli meats. Chunky nut butters. Nuts and seeds. Beans and lentils. Tomasa BlaseBacon. Hot dogs. Sausage. Dairy  High-fat cheeses. Whole milk, chocolate milk, and beverages made with milk, such as milk shakes. Half-and-half. Cream. sour cream. Ice cream. Beverages  Caffeinated beverages (such as coffee, tea, soda, or energy drinks). Alcoholic beverages. Fruit juices with pulp. Prune juice. Soft drinks sweetened with high-fructose corn syrup or sugar alcohols. High-calorie sports drinks. Fats and oils  Butter. Cream sauces. Margarine. Salad oils. Plain salad dressings. Olives. Avocados. Mayonnaise. Sweets and desserts  Sweet rolls, doughnuts, and sweet breads. Sugar-free desserts sweetened with sugar alcohols such as xylitol and sorbitol. Seasoning and other foods  Honey. Hot sauce. Chili powder. Gravy. Cream-based or milk-based soups. Pancakes and waffles. Summary  When you have diarrhea, the foods you eat and your eating habits are very important.  Make sure you get at least 8-10 cups of fluid each day, or enough to keep your urine clear or pale yellow.  Eat bland foods and gradually reintroduce healthy, nutrient-rich foods as tolerated, or as told by your health care provider.  Avoid high-fiber, fried, greasy, or spicy foods. This information is not intended to replace advice given to you by your health care provider. Make sure you discuss any questions you have with your health care provider. Document Released: 11/19/2003 Document Revised: 08/26/2016 Document  Reviewed: 08/26/2016 Elsevier Interactive Patient Education  2017 [employer]  Food Poisoning Food poisoning is an illness that is caused by eating or drinking contaminated foods or drinks. Food poisoning is usually mild and lasts 1-2 days. Foods and drinks can become contaminated because of:  Poor personal hygiene, such as not washing hands well enough or often.  Not storing food properly. For example, not refrigerating raw meat.  Serving, preparing, and storing food on surfaces that are not clean.  Cooking or eating with utensils that are not clean. The most common causes of this condition are:  Viruses.  Bacteria.  Parasites. Follow these instructions at home: Eating and drinking    Drink enough fluids to keep your pee (urine) clear or pale yellow. You may need to drink small amounts of clear liquids often.  Avoid:  Milk.  Caffeine.  Alcohol.  Ask your doctor exactly how you should get enough fluid in your body (rehydrate).  Eat small meals often rather than eating large meals. Medicines   Take over-the-counter and prescription medicines only as told by your doctor. Ask your doctor if you should continue to take any of your regular prescribed and over-the-counter medicines.  If you were prescribed an antibiotic medicine, take it as told by your doctor. Do not stop taking the antibiotic even if you start to feel better. General instructions    Wash your hands fully before you prepare food and after you go to the bathroom (use the toilet). Make sure people who live with you also wash their hands often.  Clean surfaces that you touch  with a product that contains chlorine bleach.  Keep all follow-up visits as told by your doctor. This is important. How is this prevented?  Wash your hands, food preparation surfaces, and utensils before and after you handle raw foods.  Use separate food preparation surfaces and storage spaces for raw meat and for fruits and  vegetables.  Keep refrigerated foods colder than 5F (5C).  Serve hot foods right away or keep them heated above 15F (60C).  Store dry foods in cool, dry spaces. Keep them away from too much heat or moisture.  Throw out any foods that:  Do not smell right.  Are in cans that are bulging.  Follow approved canning procedures.  Heat canned foods fully before you taste them.  Drink bottled or sterile water when you travel. Get help right away if: Call 911 or go to the emergency room if:  You have trouble:  Breathing.  Swallowing.  Talking.  Moving.  You have blurry vision.  You cannot eat or drink without throwing up (vomiting).  You pass out (faint).  Your eyes turn yellow.  You continue to throw up or have watery poop (diarrhea).  You start to have pain in your belly (abdomen), your belly pain gets worse, or your belly pain stays in one small area.  You have a fever.  You have blood or mucus in your poop (stools), or your poop looks dark black and tarry.  You have signs of dehydration, such as:  Dark pee, very little pee, or no pee.  Cracked lips.  Not making tears while crying.  Dry mouth.  Sunken eyes.  Sleepiness.  Weakness.  Dizziness. This information is not intended to replace advice given to you by your health care provider. Make sure you discuss any questions you have with your health care provider. Document Released: 02/16/2010 Document Revised: 02/04/2016 Document Reviewed: 03/02/2015 Elsevier Interactive Patient Education  2017 [employer].

## 2016-11-15 NOTE — MAU Provider Note (Signed)
History     CSN: [id number]  Arrival date and time: 11/15/16 [id number]   First Provider Initiated Contact with Patient 11/15/16 0329      Chief Complaint  Patient presents with  . Nausea  . Emesis  . Diarrhea   Susan Cross is a 26 y.o. G2P0010 at 1750w1d who presents today with nausea/vomiting/diarrhea. She states that she ate at [employer] pizza around 1800, and then started vomiting around 1900. Diarrhea started about an hour ago. She reports that her boyfriend has also gotten sick after eating at the same place with her. She denies any OB complaints at this time, denies VB, LOF or contractions.    Emesis   This is a new problem. The current episode started today. The problem occurs more than 10 times per day. The problem has been unchanged. The emesis has an appearance of bile. There has been no fever. Associated symptoms include diarrhea. Pertinent negatives include no chills or fever. Risk factors include suspect food intake. She has tried nothing for the symptoms.  Diarrhea   This is a new problem. The current episode started today. The problem occurs 2 to 4 times per day. The problem has been unchanged. The stool consistency is described as watery. Associated symptoms include vomiting. Pertinent negatives include no chills or fever. Nothing aggravates the symptoms. Risk factors include suspect food intake. She has tried nothing for the symptoms.   Past Medical History:  Diagnosis Date  . Anemia   . Genital herpes   . Genital herpes    last was early in preg  . Infection    UTI  . Medical history non-contributory     Past Surgical History:  Procedure Laterality Date  . NO PAST SURGERIES      Family History  Problem Relation Age of Onset  . Heart disease Father   . Cancer Maternal Aunt   . Cancer Maternal Grandmother   . Heart disease Paternal Grandmother     Social History  Substance Use Topics  . Smoking status: Never Smoker  . Smokeless tobacco: Never Used  . Alcohol  use Yes     Comment: occiasionally not since pregnancy    Allergies: No Known Allergies  Prescriptions Prior to Admission  Medication Sig Dispense Refill Last Dose  . cetirizine (ZYRTEC) 10 MG tablet Take 10 mg by mouth daily.   11/07/2016 at Unknown time  . nitrofurantoin, macrocrystal-monohydrate, (MACROBID) 100 MG capsule Take 1 capsule (100 mg total) by mouth 2 (two) times daily. 14 capsule 0   . promethazine (PHENERGAN) 25 MG tablet Take 1 tablet (25 mg total) by mouth every 6 (six) hours as needed for nausea or vomiting. 30 tablet 1 Past Month at Unknown time    Review of Systems  Constitutional: Negative for chills and fever.  Gastrointestinal: Positive for diarrhea, nausea and vomiting.  Genitourinary: Negative for dysuria, frequency, urgency, vaginal bleeding and vaginal discharge.   Physical Exam   Last menstrual period 07/04/2016.  Physical Exam  Nursing note and vitals reviewed. Constitutional: She is oriented to person, place, and time. She appears well-developed and well-nourished. No distress.  HENT:  Head: Normocephalic.  Cardiovascular: Normal rate.   Respiratory: Effort normal.  GI: Soft. There is no tenderness. There is no rebound.  Neurological: She is alert and oriented to person, place, and time.  Skin: Skin is warm and dry.  Psychiatric: She has a normal mood and affect.  FHT: 147 with doppler   Results for orders placed or  performed during the hospital encounter of 11/15/16 (from the past 24 hour(s))  Urinalysis, Routine w reflex microscopic     Status: Abnormal   Collection Time: 11/15/16  3:25 AM  Result Value Ref Range   Color, Urine YELLOW YELLOW   APPearance CLOUDY (A) CLEAR   Specific Gravity, Urine 1.027 1.005 - 1.030   pH 5.0 5.0 - 8.0   Glucose, UA NEGATIVE NEGATIVE mg/dL   Hgb urine dipstick NEGATIVE NEGATIVE   Bilirubin Urine NEGATIVE NEGATIVE   Ketones, ur NEGATIVE NEGATIVE mg/dL   Protein, ur 30 (A) NEGATIVE mg/dL   Nitrite NEGATIVE  NEGATIVE   Leukocytes, UA MODERATE (A) NEGATIVE   RBC / HPF 0-5 0 - 5 RBC/hpf   WBC, UA 6-30 0 - 5 WBC/hpf   Bacteria, UA FEW (A) NONE SEEN   Squamous Epithelial / LPF 6-30 (A) NONE SEEN   Mucous PRESENT   CBC     Status: Abnormal   Collection Time: 11/15/16  3:48 AM  Result Value Ref Range   WBC 9.3 4.0 - 10.5 K/uL   RBC 4.26 3.87 - 5.11 MIL/uL   Hemoglobin 11.5 (L) 12.0 - 15.0 g/dL   HCT 16.1 (L) 09.6 - 04.5 %   MCV 77.9 (L) 78.0 - 100.0 fL   MCH 27.0 26.0 - 34.0 pg   MCHC 34.6 30.0 - 36.0 g/dL   RDW 40.9 81.1 - 91.4 %   Platelets 265 150 - 400 K/uL    MAU Course  Procedures  MDM 0344: D/W Dr. Su Hilt, ok to have antiemetic here. Will get CBC/CMET, and patient can be DC home with antiemetic as needed.   Patient given D5LR with phenergan.   Assessment and Plan   1. Gastroenteritis due to food toxin   2. [redacted] weeks gestation of pregnancy    DC home Comfort measures reviewed  2ndTrimester precautions  PTL precautions  Fetal kick counts RX: phenergan PRN #[id number]  Return to MAU as needed FU with OB as planned  Follow-up Information    OZAN, JENNIFER, M, DO Follow up.   Specialty:  Obstetrics and Gynecology Contact information: [address] [place] [address] [phone]            Tawnya Crook 11/15/2016, 3:34 AM

## 2016-12-29 DIAGNOSIS — N390 Urinary tract infection, site not specified: Secondary | ICD-10-CM | POA: Diagnosis not present

## 2017-01-10 DIAGNOSIS — Z3402 Encounter for supervision of normal first pregnancy, second trimester: Secondary | ICD-10-CM | POA: Diagnosis not present

## 2017-01-23 ENCOUNTER — Encounter (HOSPITAL_COMMUNITY): Payer: Self-pay | Admitting: *Deleted

## 2017-01-23 ENCOUNTER — Inpatient Hospital Stay (HOSPITAL_COMMUNITY)
Admission: AD | Admit: 2017-01-23 | Discharge: 2017-01-23 | Disposition: A | Discharge status: Home / Self Care | Payer: Federal, State, Local not specified - PPO | Source: Ambulatory Visit | Attending: Obstetrics & Gynecology | Admitting: Obstetrics & Gynecology

## 2017-01-23 DIAGNOSIS — O1213 Gestational proteinuria, third trimester: Secondary | ICD-10-CM | POA: Diagnosis not present

## 2017-01-23 DIAGNOSIS — O26893 Other specified pregnancy related conditions, third trimester: Secondary | ICD-10-CM | POA: Insufficient documentation

## 2017-01-23 DIAGNOSIS — O9989 Other specified diseases and conditions complicating pregnancy, childbirth and the puerperium: Secondary | ICD-10-CM

## 2017-01-23 DIAGNOSIS — Z3A29 29 weeks gestation of pregnancy: Secondary | ICD-10-CM | POA: Diagnosis not present

## 2017-01-23 DIAGNOSIS — G44219 Episodic tension-type headache, not intractable: Secondary | ICD-10-CM | POA: Insufficient documentation

## 2017-01-23 DIAGNOSIS — Z3689 Encounter for other specified antenatal screening: Secondary | ICD-10-CM

## 2017-01-23 LAB — CBC
HCT: 28.4 % — ABNORMAL LOW (ref 36.0–46.0)
HEMOGLOBIN: 9.6 g/dL — AB (ref 12.0–15.0)
MCH: 26.7 pg (ref 26.0–34.0)
MCHC: 33.8 g/dL (ref 30.0–36.0)
MCV: 79.1 fL (ref 78.0–100.0)
PLATELETS: 246 10*3/uL (ref 150–400)
RBC: 3.59 MIL/uL — AB (ref 3.87–5.11)
RDW: 14.1 % (ref 11.5–15.5)
WBC: 8 10*3/uL (ref 4.0–10.5)

## 2017-01-23 LAB — URINALYSIS, ROUTINE W REFLEX MICROSCOPIC
Bilirubin Urine: NEGATIVE
GLUCOSE, UA: NEGATIVE mg/dL
Hgb urine dipstick: NEGATIVE
KETONES UR: NEGATIVE mg/dL
Nitrite: NEGATIVE
PH: 7 (ref 5.0–8.0)
Protein, ur: 30 mg/dL — AB
SPECIFIC GRAVITY, URINE: 1.025 (ref 1.005–1.030)

## 2017-01-23 LAB — COMPREHENSIVE METABOLIC PANEL
ALBUMIN: 2.7 g/dL — AB (ref 3.5–5.0)
ALK PHOS: 53 U/L (ref 38–126)
ALT: 13 U/L — AB (ref 14–54)
ANION GAP: 8 (ref 5–15)
AST: 18 U/L (ref 15–41)
BUN: 13 mg/dL (ref 6–20)
CHLORIDE: 107 mmol/L (ref 101–111)
CO2: 20 mmol/L — AB (ref 22–32)
Calcium: 8.1 mg/dL — ABNORMAL LOW (ref 8.9–10.3)
Creatinine, Ser: 0.47 mg/dL (ref 0.44–1.00)
GFR calc non Af Amer: >60 mL/min (ref >60)
GLUCOSE: 77 mg/dL (ref 65–99)
Potassium: 3.6 mmol/L (ref 3.5–5.1)
SODIUM: 135 mmol/L (ref 135–145)
Total Bilirubin: 0.4 mg/dL (ref 0.3–1.2)
Total Protein: 6.3 g/dL — ABNORMAL LOW (ref 6.5–8.1)

## 2017-01-23 LAB — PROTEIN / CREATININE RATIO, URINE
CREATININE, URINE: 163 mg/dL
PROTEIN CREATININE RATIO: 0.16 mg/mg{creat} — AB (ref 0.00–0.15)
TOTAL PROTEIN, URINE: 26 mg/dL

## 2017-01-23 MED ORDER — ACETAMINOPHEN 500 MG PO TABS
1000.0000 mg | ORAL_TABLET | Freq: Once | ORAL | Status: AC
  Administered 2017-01-23: 1000 mg via ORAL
  Filled 2017-01-23: qty 2

## 2017-01-23 NOTE — Discharge Instructions (Signed)
General Headache Without Cause A headache is pain or discomfort felt around the head or neck area. There are many causes and types of headaches. In some cases, the cause may not be found. Follow these instructions at home: Managing pain   Take over-the-counter and prescription medicines only as told by your doctor.  Lie down in a dark, quiet room when you have a headache.  If directed, apply ice to the head and neck area:  Put ice in a plastic bag.  Place a towel between your skin and the bag.  Leave the ice on for 20 minutes, 2-3 times per day.  Use a heating pad or hot shower to apply heat to the head and neck area as told by your doctor.  Keep lights dim if bright lights bother you or make your headaches worse. Eating and drinking   Eat meals on a regular schedule.  Lessen how much alcohol you drink.  Lessen how much caffeine you drink, or stop drinking caffeine. General instructions   Keep all follow-up visits as told by your doctor. This is important.  Keep a journal to find out if certain things bring on headaches. For example, write down:  What you eat and drink.  How much sleep you get.  Any change to your diet or medicines.  Relax by getting a massage or doing other relaxing activities.  Lessen stress.  Sit up straight. Do not tighten (tense) your muscles.  Do not use tobacco products. This includes cigarettes, chewing tobacco, or e-cigarettes. If you need help quitting, ask your doctor.  Exercise regularly as told by your doctor.  Get enough sleep. This often means 7-9 hours of sleep. Contact a doctor if:  Your symptoms are not helped by medicine.  You have a headache that feels different than the other headaches.  You feel sick to your stomach (nauseous) or you throw up (vomit).  You have a fever. Get help right away if:  Your headache becomes really bad.  You keep throwing up.  You have a stiff neck.  You have trouble seeing.  You have  trouble speaking.  You have pain in the eye or ear.  Your muscles are weak or you lose muscle control.  You lose your balance or have trouble walking.  You feel like you will pass out (faint) or you pass out.  You have confusion. This information is not intended to replace advice given to you by your health care provider. Make sure you discuss any questions you have with your health care provider. Document Released: 06/07/2008 Document Revised: 02/04/2016 Document Reviewed: 12/22/2014 Elsevier Interactive Patient Education  2017 [employer]vier Inc.  

## 2017-01-23 NOTE — MAU Note (Signed)
Urine in lab 

## 2017-01-23 NOTE — MAU Provider Note (Signed)
History     CSN: [id number]  Arrival date and time: 01/23/17 1718   First Provider Initiated Contact with Patient 01/23/17 1812      Chief Complaint  Patient presents with  . swelling  . Headache   HPI   [name] is a 26 y.o. female G2P0010 @ 1w0dhere in MAU with concerns about her blood pressure. She had a co-worker checked her BP at work and it read 160/110.  Patient is concerned about how her swelling and her BP are being affected by her job.   She is feeling her baby move 5-6 times per hour, however feels the movements were less today. At nighttime the baby movements keep her up. She denies leaking of water or vaginal bleeding   OB History    Gravida Para Term Preterm AB Living   2       1     SAB TAB Ectopic Multiple Live Births   1              Past Medical History:  Diagnosis Date  . Anemia   . Genital herpes   . Genital herpes    last was early in preg  . Infection    UTI  . Medical history non-contributory     Past Surgical History:  Procedure Laterality Date  . NO PAST SURGERIES      Family History  Problem Relation Age of Onset  . Heart disease Father   . Cancer Maternal Aunt   . Cancer Maternal Grandmother   . Heart disease Paternal Grandmother     Social History  Substance Use Topics  . Smoking status: Never Smoker  . Smokeless tobacco: Never Used  . Alcohol use Yes     Comment: occiasionally not since pregnancy    Allergies: No Known Allergies  Prescriptions Prior to Admission  Medication Sig Dispense Refill Last Dose  . promethazine (PHENERGAN) 25 MG tablet Take 0.5-1 tablets (12.5-25 mg total) by mouth every 6 (six) hours as needed. 30 tablet 0    Results for orders placed or performed during the hospital encounter of 01/23/17 (from the past 48 hour(s))  Urinalysis, Routine w reflex microscopic     Status: Abnormal   Collection Time: 01/23/17  5:28 PM  Result Value Ref Range   Color, Urine YELLOW YELLOW   APPearance  CLOUDY (A) CLEAR   Specific Gravity, Urine 1.025 1.005 - 1.030   pH 7.0 5.0 - 8.0   Glucose, UA NEGATIVE NEGATIVE mg/dL   Hgb urine dipstick NEGATIVE NEGATIVE   Bilirubin Urine NEGATIVE NEGATIVE   Ketones, ur NEGATIVE NEGATIVE mg/dL   Protein, ur 30 (A) NEGATIVE mg/dL   Nitrite NEGATIVE NEGATIVE   Leukocytes, UA LARGE (A) NEGATIVE   RBC / HPF 0-5 0 - 5 RBC/hpf   WBC, UA TOO NUMEROUS TO COUNT 0 - 5 WBC/hpf   Bacteria, UA RARE (A) NONE SEEN   Squamous Epithelial / LPF 6-30 (A) NONE SEEN   Mucous PRESENT   Protein / creatinine ratio, urine     Status: Abnormal   Collection Time: 01/23/17  5:28 PM  Result Value Ref Range   Creatinine, Urine 163.00 mg/dL   Total Protein, Urine 26 mg/dL    Comment: NO NORMAL RANGE ESTABLISHED FOR THIS TEST   Protein Creatinine Ratio 0.16 (H) 0.00 - 0.15 mg/mg[Cre]  CBC     Status: Abnormal   Collection Time: 01/23/17  7:39 PM  Result Value Ref Range   WBC  8.0 4.0 - 10.5 K/uL   RBC 3.59 (L) 3.87 - 5.11 MIL/uL   Hemoglobin 9.6 (L) 12.0 - 15.0 g/dL   HCT 28.4 (L) 36.0 - 46.0 %   MCV 79.1 78.0 - 100.0 fL   MCH 26.7 26.0 - 34.0 pg   MCHC 33.8 30.0 - 36.0 g/dL   RDW 14.1 11.5 - 15.5 %   Platelets 246 150 - 400 K/uL  Comprehensive metabolic panel     Status: Abnormal   Collection Time: 01/23/17  7:39 PM  Result Value Ref Range   Sodium 135 135 - 145 mmol/L   Potassium 3.6 3.5 - 5.1 mmol/L   Chloride 107 101 - 111 mmol/L   CO2 20 (L) 22 - 32 mmol/L   Glucose, Bld 77 65 - 99 mg/dL   BUN 13 6 - 20 mg/dL   Creatinine, Ser 0.47 0.44 - 1.00 mg/dL   Calcium 8.1 (L) 8.9 - 10.3 mg/dL   Total Protein 6.3 (L) 6.5 - 8.1 g/dL   Albumin 2.7 (L) 3.5 - 5.0 g/dL   AST 18 15 - 41 U/L   ALT 13 (L) 14 - 54 U/L   Alkaline Phosphatase 53 38 - 126 U/L   Total Bilirubin 0.4 0.3 - 1.2 mg/dL   GFR calc non Af Amer >60 >60 mL/min   GFR calc Af Amer >60 >60 mL/min    Comment: (NOTE) The eGFR has been calculated using the CKD EPI equation. This calculation has not been  validated in all clinical situations. eGFR's persistently <60 mL/min signify possible Chronic Kidney Disease.    Anion gap 8 5 - 15    Review of Systems  Cardiovascular: Positive for leg swelling.  Genitourinary: Negative for vaginal bleeding.  Neurological: Positive for headaches (Tension, type ).   Physical Exam   Blood pressure 116/70, pulse (!) 105, temperature 98.3 F (36.8 C), temperature source Oral, resp. rate 18, last menstrual period 07/04/2016, SpO2 100 %.   Patient Vitals for the past 24 hrs:  BP Temp Temp src Pulse Resp SpO2  01/23/17 2045 112/68 - - 78 16 -  01/23/17 1929 102/69 - - - - -  01/23/17 1846 104/71 - - 95 - -  01/23/17 1830 (!) 91/46 - - 90 - -  01/23/17 1815 109/68 - - 96 - -  01/23/17 1810 (!) 92/55 - - 93 - -  01/23/17 1733 116/70 - - (!) 105 - -  01/23/17 1732 116/70 98.3 F (36.8 C) Oral (!) 105 18 100 %   Physical Exam  Constitutional: She is oriented to person, place, and time. She appears well-developed and well-nourished. No distress.  HENT:  Head: Normocephalic.  Respiratory: Effort normal.  Musculoskeletal: Normal range of motion. She exhibits no edema or tenderness.  Neurological: She is alert and oriented to person, place, and time. She has normal reflexes. She displays normal reflexes.  Skin: Skin is warm. She is not diaphoretic.  Psychiatric: Her behavior is normal.   Fetal Tracing: Baseline: 140 bpm Variability: Moderate  Accelerations: 15x15 Decelerations: None Toco: None  MAU Course  Procedures  None  MDM  Urine culture pending> patient asymptomatic  Patient discussed with Dr. Alesia Richards, discussed Bp readings and NST. Most recent BP 104/71: preeclampsia labs per Dr. Alesia Richards: CBC, CMP, Protein creatine ratio.  Tylenol 1 gram PO given, patients HA down to 0/10  Assessment and Plan   A:  1. Episodic tension-type headache, not intractable   2. Proteinuria affecting pregnancy in third trimester  3. NST (non-stress test)  reactive     P:  Discharge home in stable condition Follow up with OB this week. Return to MAU if symptoms worsen: strict return precautions Ok to use tylenol over the counter as directed on the bottle   Noni Saupe I, NP 01/23/2017 9:45 PM

## 2017-01-23 NOTE — MAU Note (Signed)
Patient c/o increase in swelling in hands and feet. States having trouble at work. Increased bp at work today 161/110. Has headache currently. Decreased fetal movement

## 2017-01-24 LAB — CULTURE, OB URINE: SPECIAL REQUESTS: NORMAL

## 2017-02-08 DIAGNOSIS — Z23 Encounter for immunization: Secondary | ICD-10-CM | POA: Diagnosis not present

## 2017-02-20 ENCOUNTER — Inpatient Hospital Stay (HOSPITAL_COMMUNITY)
Admission: AD | Admit: 2017-02-20 | Discharge: 2017-02-20 | Disposition: A | Discharge status: Home / Self Care | Payer: Federal, State, Local not specified - PPO | Source: Ambulatory Visit | Attending: Obstetrics & Gynecology | Admitting: Obstetrics & Gynecology

## 2017-02-20 ENCOUNTER — Encounter (HOSPITAL_COMMUNITY): Payer: Self-pay | Admitting: *Deleted

## 2017-02-20 DIAGNOSIS — O26893 Other specified pregnancy related conditions, third trimester: Secondary | ICD-10-CM | POA: Diagnosis not present

## 2017-02-20 DIAGNOSIS — O9989 Other specified diseases and conditions complicating pregnancy, childbirth and the puerperium: Secondary | ICD-10-CM

## 2017-02-20 DIAGNOSIS — Z8249 Family history of ischemic heart disease and other diseases of the circulatory system: Secondary | ICD-10-CM | POA: Diagnosis not present

## 2017-02-20 DIAGNOSIS — R102 Pelvic and perineal pain: Secondary | ICD-10-CM | POA: Diagnosis not present

## 2017-02-20 DIAGNOSIS — M549 Dorsalgia, unspecified: Secondary | ICD-10-CM | POA: Diagnosis not present

## 2017-02-20 DIAGNOSIS — Z3A33 33 weeks gestation of pregnancy: Secondary | ICD-10-CM | POA: Diagnosis not present

## 2017-02-20 LAB — URINALYSIS, ROUTINE W REFLEX MICROSCOPIC
Bilirubin Urine: NEGATIVE
GLUCOSE, UA: 50 mg/dL — AB
Hgb urine dipstick: NEGATIVE
KETONES UR: NEGATIVE mg/dL
Nitrite: NEGATIVE
PROTEIN: 30 mg/dL — AB
Specific Gravity, Urine: 1.029 (ref 1.005–1.030)
pH: 6 (ref 5.0–8.0)

## 2017-02-20 LAB — FETAL FIBRONECTIN: FETAL FIBRONECTIN: NEGATIVE

## 2017-02-20 NOTE — Discharge Instructions (Signed)
°Pelvic pain (SPD) °Approved by the [facility] [employer] Board °Share ° ° °In this article °What is symphysis pubis dysfunction?  °What are the symptoms of SPD?  °What causes SPD?  °How is SPD diagnosed?  °How is SPD treated?  °What can I do to ease the pain of SPD?  °Will I recover from SPD after I’ve had my baby ?  °Where can I get help and support? °Video  °Your pelvic floor °Finding and exercising this crucial area. What is symphysis pubis dysfunction?Symphysis pubis dysfunction (SPD) is a problem with the pelvis. Your pelvis is mainly formed of two pubic bones that curve round to make a cradle shape. The pubic bones meet at the front of your pelvis, at a firm joint called the symphysis pubis.  ° °The joint's connection is made strong by a dense network of tough tissues (ligaments). During pregnancy, swelling and pain can make the symphysis pubis joint less stable, causing SPD. ° °Doctors and physiotherapists classify any type of pelvic pain during pregnancy as pelvic girdle pain (PGP).  ° °SPD is one type of pelvic girdle pain. Diastasis symphysis pubis (DSP) is another type of pelvic girdle pain, which is related to SPD. DSP happens when the gap in the symphysis pubis joint widens too far. DSP is rare, and can only be diagnosed by an X-ray, ultrasound scan or MRI scan. What are the symptoms of SPD? Pain in the pubic area and groin are the most common symptoms, though you may also notice:  ° °Back pain, pain at the back of your pelvis or hip pain.  °Pain, along with a grinding or clicking sensation in your pubic area.  °Pain down the inside of your thighs or between your legs.  °Pain that's made worse by parting your legs, walking, going up or down stairs or moving around in bed.  °Pain that's worse at night and stops you from sleeping well. Getting up to go to the toilet in the middle of the night can be especially painful. ° °SPD can occur at any time during your pregnancy or after giving birth. You  may notice it for the first time during the middle of your pregnancy. What causes SPD?During pregnancy, your body produces a hormone called relaxin, which softens your ligaments to help your baby pass through your pelvis. This means that the joints in your pelvis naturally become more lax.  ° °However, this flexibility doesn't necessarily cause the painful problems of SPD. Usually, your nerves and muscles are able to adapt and compensate for the greater flexibility in your joints. This means your body should cope well with the changes to your posture as your baby grows. ° °SPD is thought to happen when your body doesn't adapt so well to the stretchier, looser ligaments caused by relaxin. SPD can be triggered by: ° °the joints in your pelvis moving unevenly  °changes to the way your muscles work to support your pelvic girdle joints  °one pelvic joint not working properly and causing knock-on pain in the other joints of your pelvis ° °These problems mean that your pelvis is not as stable as it should be, and this is what causes SPD. Physiotherapy is the best way to treat SPD, because it's about the relationship between your muscles and bones, rather than how lax your joints are. You're more likely to develop SPD if: ° °you had pelvic girdle pain or pelvic joint pain before you became pregnant  °you've had a previous injury to your   pelvis  °you've had pelvic girdle pain in a previous pregnancy  °you have a high BMI and were overweight before you became pregnant  °hypermobility in all your joints  °How is SPD diagnosed? Your doctor or midwife should refer you to a women’s health physiotherapist. Your physiotherapist will test the stability, movement and pain in your pelvic joints and muscles. How is SPD treated?SPD is managed in the same way as other pelvic girdle pain. Treatment includes:  ° °Exercises to strengthen your spinal, tummy, pelvic girdle, hip and pelvic floor muscles. These will improve the stability of  your pelvis and back. You may need gentle, hands-on treatment of your hip, back or pelvis to correct stiffness or imbalance. Water gymnastics can sometimes help.  °Your physiotherapist should advise you on how to make daily activities less painful and on how to make the birth of your baby easier. Your midwife should help you to write a birth plan that takes into account your SPD symptoms.  °Acupuncture may help reduce the pain and is safe during pregnancy. Make sure your practitioner is trained and experienced in working with pregnant women.  °Other manual therapies, such as osteopathy may help. See a registered practitioner who is experienced in treating pregnant women.  °A pelvic support belt may give relief, particularly when you're exercising or active. °What can I do to ease the pain of SPD?  °Be as active as you can, but don't push yourself so far that it hurts.  °Stick to the pelvic floor and tummy exercises that your physiotherapist recommends.  °Ask for and accept offers of help with daily chores.  °Plan ahead so that you reduce the activities that cause you problems. You could use a rucksack to carry things around, both indoors and out.  °Take care to part your legs no further than your pain-free range, particularly when getting in and out of the car, bed or bath. If you are lying down, pull up your knees as far as you can to make it easier to part your legs. If you are sitting, try arching your back and sticking your chest out before parting or moving your legs.  °Avoid activities that make your pain worse or that put your pelvis in an uneven position, such as sitting cross-legged or carrying your toddler on your hip. If something hurts, stop doing it. If the pain is allowed to flare up, it can take a long time to settle down again.  °Try to sleep on your side with legs bent and a pillow between your knees.  °Rest regularly or sit down for activities you would normally do standing, such as ironing. By  sitting on a birth ball or by getting down on your hands and knees, you'll take the weight of your baby off your pelvis.  °Try not to do heavy lifting or pushing. Pushing supermarket trolleys can often make your pain worse, so shop online or ask someone to shop for you.  °When climbing stairs, take one step at a time. Step up onto one step with your best leg and then bring your other leg to meet it. Repeat with each step.  °Avoid standing on one leg. When getting dressed, sit down to pull on your knickers or trousers. °Will I recover from SPD after I’ve had my baby ? You’re very likely to recover within a few weeks to a few months after your baby is born. If you can, carry on with physiotherapy after the birth. Try to get   help with looking after your baby during the early weeks.  ° °You may find you get twinges every month just before your period is due. This is likely to be caused by hormones that have a similar effect to pregnancy hormones.  ° °If you have SPD in one pregnancy, it is more likely that you’ll have it next time you get pregnant. Ask your midwife to refer you to a physiotherapist early on. SPD may not necessarily be as bad next time if it is well managed from the start of pregnancy.  ° °You could consider giving yourself a bit of time from one pregnancy to the next. Losing excess weight, getting fit and waiting until your children can walk may help reduce the symptoms of getting any type of pelvic pain next time. Where can I get help and support?The Association of Chartered [employer] in Women’s [employer] can provide a list of physiotherapists in your area. ° °You can get in touch with other women in your situation by contacting [facility], a charity that offers support to women with pelvic girdle pain, including SPD, or by visiting our community.  ° °See our photo guide to pregnancy stretches, designed to help relieve those aches and pains.  ° °Last reviewed: July 2015 °

## 2017-02-20 NOTE — MAU Provider Note (Signed)
Chief Complaint:  No chief complaint on file.   First Provider Initiated Contact with Patient 02/20/17 1832      HPI: Susan Cross is a 26 y.o. G2P0010 at [redacted]w[redacted]d who presents to maternity admissions reporting pelvic and back pain starting this morning.  Her pain is constant dull pain but worse and sharp when she walks, changes position in bed, or gets out of the car. She tried Tylenol per her OB on call today, Dr Charlotta Newton, but it did not help.  She denies any associated symptoms. She reports good fetal movement, denies LOF, vaginal bleeding, vaginal itching/burning, urinary symptoms, h/a, dizziness, n/v, or fever/chills.    HPI  Past Medical History: Past Medical History:  Diagnosis Date  . Anemia   . Genital herpes   . Genital herpes    last was early in preg  . Infection    UTI  . Medical history non-contributory     Past obstetric history: OB History  Gravida Para Term Preterm AB Living  2       1    SAB TAB Ectopic Multiple Live Births  1            # Outcome Date GA Lbr Len/2nd Weight Sex Delivery Anes PTL Lv  2 Current           1 SAB               Past Surgical History: Past Surgical History:  Procedure Laterality Date  . NO PAST SURGERIES      Family History: Family History  Problem Relation Age of Onset  . Heart disease Father   . Cancer Maternal Aunt   . Cancer Maternal Grandmother   . Heart disease Paternal Grandmother     Social History: Social History  Substance Use Topics  . Smoking status: Never Smoker  . Smokeless tobacco: Never Used  . Alcohol use Yes     Comment: occiasionally not since pregnancy    Allergies: No Known Allergies  Meds:  Prescriptions Prior to Admission  Medication Sig Dispense Refill Last Dose  . Prenatal Vit-Min-FA-Fish Oil ([facility] PRENATAL GUMMY PO) Take by mouth.   Past Week at Unknown time  . valACYclovir (VALTREX) 500 MG tablet Take 500 mg by mouth daily.   Past Week at Unknown time  . promethazine (PHENERGAN) 25 MG  tablet Take 0.5-1 tablets (12.5-25 mg total) by mouth every 6 (six) hours as needed. (Patient not taking: Reported on 02/20/2017) 30 tablet 0 Not Taking at Unknown time    ROS:  Review of Systems  Constitutional: Negative for chills, fatigue and fever.  Eyes: Negative for visual disturbance.  Respiratory: Negative for shortness of breath.   Cardiovascular: Negative for chest pain.  Gastrointestinal: Positive for abdominal pain. Negative for nausea and vomiting.  Genitourinary: Positive for pelvic pain. Negative for difficulty urinating, dysuria, flank pain, vaginal bleeding, vaginal discharge and vaginal pain.  Musculoskeletal: Positive for back pain.  Neurological: Negative for dizziness and headaches.  Psychiatric/Behavioral: Negative.      I have reviewed patient's Past Medical Hx, Surgical Hx, Family Hx, Social Hx, medications and allergies.   Physical Exam  Patient Vitals for the past 24 hrs:  BP Temp Temp src Pulse Resp SpO2 Weight  02/20/17 1745 112/67 98.4 F (36.9 C) Oral 85 18 97 % 175 lb 12 oz (79.7 kg)   Constitutional: Well-developed, well-nourished female in no acute distress.  Cardiovascular: normal rate Respiratory: normal effort GI: Abd soft, non-tender, gravid appropriate  for gestational age.  MS: Extremities nontender, no edema, normal ROM Neurologic: Alert and oriented x 4.  GU: Neg CVAT.  PELVIC EXAM: FFN collected by blind swab  Dilation: Fingertip Effacement (%): Thick Cervical Position: Posterior Station: Ballotable Exam by:: Julita Ozbun leftwhich kirby cnm  FHT:  Baseline 135 , moderate variability, accelerations present, no decelerations Contractions: None on toco or to palpation   Labs: Results for orders placed or performed during the hospital encounter of 02/20/17 (from the past 24 hour(s))  Urinalysis, Routine w reflex microscopic     Status: Abnormal   Collection Time: 02/20/17  5:47 PM  Result Value Ref Range   Color, Urine YELLOW YELLOW    APPearance CLOUDY (A) CLEAR   Specific Gravity, Urine 1.029 1.005 - 1.030   pH 6.0 5.0 - 8.0   Glucose, UA 50 (A) NEGATIVE mg/dL   Hgb urine dipstick NEGATIVE NEGATIVE   Bilirubin Urine NEGATIVE NEGATIVE   Ketones, ur NEGATIVE NEGATIVE mg/dL   Protein, ur 30 (A) NEGATIVE mg/dL   Nitrite NEGATIVE NEGATIVE   Leukocytes, UA LARGE (A) NEGATIVE   RBC / HPF 0-5 0 - 5 RBC/hpf   WBC, UA TOO NUMEROUS TO COUNT 0 - 5 WBC/hpf   Bacteria, UA FEW (A) NONE SEEN   Squamous Epithelial / LPF 6-30 (A) NONE SEEN   Mucous PRESENT   Fetal fibronectin     Status: None   Collection Time: 02/20/17  6:30 PM  Result Value Ref Range   Fetal Fibronectin NEGATIVE NEGATIVE   --/--/O POS (11/25 1831)  Imaging:  No results found.  MAU Course/MDM: I have ordered labs and reviewed results.  NST reviewed and reactive FFN negative with cervix FT/long so no evidence of preterm labor today Consult Dr Normand Sloopillard with presentation, exam findings and test results.  Urine sent for culture Pt to f/u this week with [facility], return to MAU as needed for emergencies Pt stable at time of discharge.  Today's evaluation included a work-up for preterm labor which can be life-threatening for both mom and baby.  Assessment: 1. Pelvic pain affecting pregnancy in third trimester, antepartum   2. Back pain affecting pregnancy in third trimester     Plan: Discharge home Labor precautions and fetal kick counts Follow-up Information    Myna HidalgoOzan, Jennifer, DO Follow up.   Specialty:  Obstetrics and Gynecology Why:  Call the office if symptoms persist, return to MAU as needed for emergencies. Contact information: [address] [place] [address] [phone]          Allergies as of 02/20/2017   No Known Allergies     Medication List    STOP taking these medications   promethazine 25 MG tablet Commonly known as:  PHENERGAN     TAKE these medications   [employer] PRENATAL GUMMY PO Take by mouth.    valACYclovir 500 MG tablet Commonly known as:  VALTREX Take 500 mg by mouth daily.       Sharen CounterLisa Leftwich-Kirby Certified Nurse-Midwife 02/20/2017 7:42 PM

## 2017-02-20 NOTE — MAU Note (Signed)
pressure in low back and hips, started yesterday.  Took Tylenol as instructed, did not help, so was told to come in. Denies any urinary problems

## 2017-02-22 LAB — CULTURE, OB URINE

## 2017-03-14 DIAGNOSIS — Z3403 Encounter for supervision of normal first pregnancy, third trimester: Secondary | ICD-10-CM | POA: Diagnosis not present

## 2017-03-23 ENCOUNTER — Encounter (HOSPITAL_COMMUNITY): Payer: Self-pay

## 2017-03-23 ENCOUNTER — Inpatient Hospital Stay (HOSPITAL_COMMUNITY)
Admission: AD | Admit: 2017-03-23 | Discharge: 2017-03-23 | Disposition: A | Discharge status: Home / Self Care | Payer: Federal, State, Local not specified - PPO | Source: Ambulatory Visit | Attending: Obstetrics & Gynecology | Admitting: Obstetrics & Gynecology

## 2017-03-23 DIAGNOSIS — R109 Unspecified abdominal pain: Secondary | ICD-10-CM | POA: Diagnosis not present

## 2017-03-23 DIAGNOSIS — O99283 Endocrine, nutritional and metabolic diseases complicating pregnancy, third trimester: Secondary | ICD-10-CM | POA: Insufficient documentation

## 2017-03-23 DIAGNOSIS — O99613 Diseases of the digestive system complicating pregnancy, third trimester: Secondary | ICD-10-CM | POA: Insufficient documentation

## 2017-03-23 DIAGNOSIS — K529 Noninfective gastroenteritis and colitis, unspecified: Secondary | ICD-10-CM | POA: Diagnosis not present

## 2017-03-23 DIAGNOSIS — Z3A37 37 weeks gestation of pregnancy: Secondary | ICD-10-CM

## 2017-03-23 DIAGNOSIS — E860 Dehydration: Secondary | ICD-10-CM

## 2017-03-23 DIAGNOSIS — Z3689 Encounter for other specified antenatal screening: Secondary | ICD-10-CM | POA: Diagnosis not present

## 2017-03-23 DIAGNOSIS — O9989 Other specified diseases and conditions complicating pregnancy, childbirth and the puerperium: Secondary | ICD-10-CM

## 2017-03-23 DIAGNOSIS — Z8249 Family history of ischemic heart disease and other diseases of the circulatory system: Secondary | ICD-10-CM | POA: Insufficient documentation

## 2017-03-23 DIAGNOSIS — R1110 Vomiting, unspecified: Secondary | ICD-10-CM | POA: Diagnosis present

## 2017-03-23 LAB — COMPREHENSIVE METABOLIC PANEL
ALT: 12 U/L — ABNORMAL LOW (ref 14–54)
AST: 21 U/L (ref 15–41)
Albumin: 2.9 g/dL — ABNORMAL LOW (ref 3.5–5.0)
Alkaline Phosphatase: 142 U/L — ABNORMAL HIGH (ref 38–126)
Anion gap: 6 (ref 5–15)
BILIRUBIN TOTAL: 0.4 mg/dL (ref 0.3–1.2)
BUN: 10 mg/dL (ref 6–20)
CALCIUM: 8.7 mg/dL — AB (ref 8.9–10.3)
CO2: 20 mmol/L — ABNORMAL LOW (ref 22–32)
Chloride: 108 mmol/L (ref 101–111)
Creatinine, Ser: 0.47 mg/dL (ref 0.44–1.00)
Glucose, Bld: 72 mg/dL (ref 65–99)
POTASSIUM: 3.9 mmol/L (ref 3.5–5.1)
Sodium: 134 mmol/L — ABNORMAL LOW (ref 135–145)
TOTAL PROTEIN: 6.8 g/dL (ref 6.5–8.1)

## 2017-03-23 LAB — CBC
HCT: 32 % — ABNORMAL LOW (ref 36.0–46.0)
HEMOGLOBIN: 10.6 g/dL — AB (ref 12.0–15.0)
MCH: 25.4 pg — AB (ref 26.0–34.0)
MCHC: 33.1 g/dL (ref 30.0–36.0)
MCV: 76.6 fL — AB (ref 78.0–100.0)
Platelets: 297 10*3/uL (ref 150–400)
RBC: 4.18 MIL/uL (ref 3.87–5.11)
RDW: 13.7 % (ref 11.5–15.5)
WBC: 8.6 10*3/uL (ref 4.0–10.5)

## 2017-03-23 LAB — WET PREP, GENITAL
Clue Cells Wet Prep HPF POC: NONE SEEN
Sperm: NONE SEEN
TRICH WET PREP: NONE SEEN
YEAST WET PREP: NONE SEEN

## 2017-03-23 LAB — URINALYSIS, ROUTINE W REFLEX MICROSCOPIC
BILIRUBIN URINE: NEGATIVE
Glucose, UA: NEGATIVE mg/dL
Hgb urine dipstick: NEGATIVE
Ketones, ur: 5 mg/dL — AB
NITRITE: NEGATIVE
PROTEIN: NEGATIVE mg/dL
SPECIFIC GRAVITY, URINE: 1.016 (ref 1.005–1.030)
pH: 6 (ref 5.0–8.0)

## 2017-03-23 MED ORDER — LACTATED RINGERS IV BOLUS (SEPSIS)
1000.0000 mL | Freq: Once | INTRAVENOUS | Status: AC
  Administered 2017-03-23: 1000 mL via INTRAVENOUS

## 2017-03-23 MED ORDER — ONDANSETRON 4 MG PO TBDP: 4.0000 mg | ORAL_TABLET | Freq: Three times a day (TID) | ORAL | 0 refills | Status: DC | PRN

## 2017-03-23 MED ORDER — SODIUM CHLORIDE 0.9 % IV SOLN
8.0000 mg | Freq: Once | INTRAVENOUS | Status: AC
  Administered 2017-03-23: 8 mg via INTRAVENOUS
  Filled 2017-03-23: qty 4

## 2017-03-23 NOTE — MAU Note (Signed)
Pt reports she has had N/V/D all day. Had some fluid leak out a few times as well. C/o pressure in her back and good fetal movements felt

## 2017-03-23 NOTE — Progress Notes (Addendum)
G2p) @ 37.[redacted] wksga. Presents to triage for possible LOF. Denies bleeding. Primary complaint is dt N/V/D. + fetal movement noted. EFM applied.   N/V/D started this morning. Leaking this morning one time but no longer leaking.   1647: Provider at bs assessing pt. Speculum exam done. wetprep and fern done  1659: labs and IV intiated.   1715: Zofran 2 mg IVPB initiated.  1810: Pt states feeling much better with nausea. LR bolusing credit of 400 in bag.

## 2017-03-23 NOTE — MAU Provider Note (Signed)
History     CSN: [id number]  Arrival date and time: 03/23/17 1501   First Provider Initiated Contact with Patient 03/23/17 1639      Chief Complaint  Patient presents with  . Emesis  . Diarrhea  . Rupture of Membranes   G2P0010 @37 .3 weeks here with N/V/D and LOF. Diarrhea started this am and occurred 4 times. Vomiting started around noon and had 3 episodes. No sick contacts. No fevers. Ate [place] last night for dinner. She reports leaking fluid down her thighs just before vomiting at noon. No leaking since. Reports good FM. No VB or ctx.    OB History    Gravida Para Term Preterm AB Living   2       1     SAB TAB Ectopic Multiple Live Births   1              Past Medical History:  Diagnosis Date  . Anemia   . Genital herpes   . Genital herpes    last was early in preg  . Infection    UTI  . Medical history non-contributory     Past Surgical History:  Procedure Laterality Date  . NO PAST SURGERIES      Family History  Problem Relation Age of Onset  . Heart disease Father   . Cancer Maternal Aunt   . Cancer Maternal Grandmother   . Heart disease Paternal Grandmother     Social History  Substance Use Topics  . Smoking status: Never Smoker  . Smokeless tobacco: Never Used  . Alcohol use Yes     Comment: occiasionally not since pregnancy    Allergies: No Known Allergies  Prescriptions Prior to Admission  Medication Sig Dispense Refill Last Dose  . Prenatal Vit-Min-FA-Fish Oil ([facility] PRENATAL GUMMY PO) Take by mouth.   Past Week at Unknown time  . valACYclovir (VALTREX) 500 MG tablet Take 500 mg by mouth daily.   Past Week at Unknown time    Review of Systems  Constitutional: Negative for fever.  Gastrointestinal: Positive for diarrhea, nausea and vomiting. Negative for abdominal pain.  Genitourinary: Positive for vaginal discharge. Negative for vaginal bleeding.   Physical Exam   Blood pressure 133/77, pulse 92, temperature 98 F (36.7 C),  temperature source Oral, resp. rate 18, height 5\' 1"  (1.549 m), weight 180 lb (81.6 kg), last menstrual period 07/04/2016.  Physical Exam  Constitutional: She is oriented to person, place, and time. She appears well-developed and well-nourished. No distress.  HENT:  Head: Normocephalic and atraumatic.  Neck: Normal range of motion.  Respiratory: Effort normal. No respiratory distress.  GI: Soft. She exhibits no distension. There is no tenderness.  gravid  Genitourinary:  Genitourinary Comments: External: no lesions or erythema Vagina: rugated, pink, moist, moderate yellow discharge, no pool, fern neg    Musculoskeletal: Normal range of motion.  Neurological: She is alert and oriented to person, place, and time.  Skin: Skin is warm and dry.  Psychiatric: She has a normal mood and affect.   EFM: 120 bpm, mod variability, + accels, no decels Toco: none  Results for orders placed or performed during the hospital encounter of 03/23/17 (from the past 24 hour(s))  Urinalysis, Routine w reflex microscopic     Status: Abnormal   Collection Time: 03/23/17  3:13 PM  Result Value Ref Range   Color, Urine YELLOW YELLOW   APPearance HAZY (A) CLEAR   Specific Gravity, Urine 1.016 1.005 - 1.030  pH 6.0 5.0 - 8.0   Glucose, UA NEGATIVE NEGATIVE mg/dL   Hgb urine dipstick NEGATIVE NEGATIVE   Bilirubin Urine NEGATIVE NEGATIVE   Ketones, ur 5 (A) NEGATIVE mg/dL   Protein, ur NEGATIVE NEGATIVE mg/dL   Nitrite NEGATIVE NEGATIVE   Leukocytes, UA LARGE (A) NEGATIVE   RBC / HPF 0-5 0 - 5 RBC/hpf   WBC, UA TOO NUMEROUS TO COUNT 0 - 5 WBC/hpf   Bacteria, UA FEW (A) NONE SEEN   Squamous Epithelial / LPF 0-5 (A) NONE SEEN   Mucous PRESENT   Wet prep, genital     Status: Abnormal   Collection Time: 03/23/17  4:51 PM  Result Value Ref Range   Yeast Wet Prep HPF POC NONE SEEN NONE SEEN   Trich, Wet Prep NONE SEEN NONE SEEN   Clue Cells Wet Prep HPF POC NONE SEEN NONE SEEN   WBC, Wet Prep HPF POC  MANY (A) NONE SEEN   Sperm NONE SEEN   CBC     Status: Abnormal   Collection Time: 03/23/17  5:00 PM  Result Value Ref Range   WBC 8.6 4.0 - 10.5 K/uL   RBC 4.18 3.87 - 5.11 MIL/uL   Hemoglobin 10.6 (L) 12.0 - 15.0 g/dL   HCT 16.132.0 (L) 09.636.0 - 04.546.0 %   MCV 76.6 (L) 78.0 - 100.0 fL   MCH 25.4 (L) 26.0 - 34.0 pg   MCHC 33.1 30.0 - 36.0 g/dL   RDW 40.913.7 81.111.5 - 91.415.5 %   Platelets 297 150 - 400 K/uL  Comprehensive metabolic panel     Status: Abnormal   Collection Time: 03/23/17  5:00 PM  Result Value Ref Range   Sodium 134 (L) 135 - 145 mmol/L   Potassium 3.9 3.5 - 5.1 mmol/L   Chloride 108 101 - 111 mmol/L   CO2 20 (L) 22 - 32 mmol/L   Glucose, Bld 72 65 - 99 mg/dL   BUN 10 6 - 20 mg/dL   Creatinine, Ser 7.820.47 0.44 - 1.00 mg/dL   Calcium 8.7 (L) 8.9 - 10.3 mg/dL   Total Protein 6.8 6.5 - 8.1 g/dL   Albumin 2.9 (L) 3.5 - 5.0 g/dL   AST 21 15 - 41 U/L   ALT 12 (L) 14 - 54 U/L   Alkaline Phosphatase 142 (H) 38 - 126 U/L   Total Bilirubin 0.4 0.3 - 1.2 mg/dL   GFR calc non Af Amer >60 >60 mL/min   GFR calc Af Amer >60 >60 mL/min   Anion gap 6 5 - 15   MAU Course  Procedures LR 1 L  Zofran 8mg    MDM Labs ordered and reviewed. Tolerating po. No emesis. No evidence of ROM. Presentation, clinical findings, and plan discussed with Dr. Richardson Doppole. Stable for discharge home.   Assessment and Plan   1. [redacted] weeks gestation of pregnancy   2. NST (non-stress test) reactive   3. Gastroenteritis   4.      Dehydration  Discharge home Follow up in OB office as scheduled BRAT diet Rx Zofran Maintain hydration  Allergies as of 03/23/2017   No Known Allergies     Medication List    TAKE these medications   [facility] PRENATAL GUMMY PO Take by mouth.   ondansetron 4 MG disintegrating tablet Commonly known as:  ZOFRAN ODT Take 1 tablet (4 mg total) by mouth every 8 (eight) hours as needed for nausea or vomiting.   valACYclovir 500 MG tablet Commonly known as:  VALTREX Take 500 mg by mouth  daily.      Donette Larry, CNM 03/23/2017, 4:51 PM

## 2017-03-23 NOTE — Discharge Instructions (Signed)
Viral Gastroenteritis, Adult Viral gastroenteritis is also known as the stomach flu. This condition is caused by certain germs (viruses). These germs can be passed from person to person very easily (are very contagious). This condition can cause sudden watery poop (diarrhea), fever, and throwing up (vomiting). Having watery poop and throwing up can make you feel weak and cause you to get dehydrated. Dehydration can make you tired and thirsty, make you have a dry mouth, and make it so you pee (urinate) less often. Older adults and people with other diseases or a weak defense system (immune system) are at higher risk for dehydration. It is important to replace the fluids that you lose from having watery poop and throwing up. Follow these instructions at home: Follow instructions from your doctor about how to care for yourself at home. Eating and drinking  Follow these instructions as told by your doctor:  Take an oral rehydration solution (ORS). This is a drink that is sold at pharmacies and stores.  Drink clear fluids in small amounts as you are able, such as: ? Water. ? Ice chips. ? Diluted fruit juice. ? Low-calorie sports drinks.  Eat bland, easy-to-digest foods in small amounts as you are able, such as: ? Bananas. ? Applesauce. ? Rice. ? Low-fat (lean) meats. ? Toast. ? Crackers.  Avoid fluids that have a lot of sugar or caffeine in them.  Avoid alcohol.  Avoid spicy or fatty foods.  General instructions  Drink enough fluid to keep your pee (urine) clear or pale yellow.  Wash your hands often. If you cannot use soap and water, use hand sanitizer.  Make sure that all people in your home wash their hands well and often.  Rest at home while you get better.  Take over-the-counter and prescription medicines only as told by your doctor.  Watch your condition for any changes.  Take a warm bath to help with any burning or pain from having watery poop.  Keep all follow-up  visits as told by your doctor. This is important. Contact a doctor if:  You cannot keep fluids down.  Your symptoms get worse.  You have new symptoms.  You feel light-headed or dizzy.  You have muscle cramps. Get help right away if:  You have chest pain.  You feel very weak or you pass out (faint).  You see blood in your throw-up.  Your throw-up looks like coffee grounds.  You have bloody or black poop (stools) or poop that look like tar.  You have a very bad headache, a stiff neck, or both.  You have a rash.  You have very bad pain, cramping, or bloating in your belly (abdomen).  You have trouble breathing.  You are breathing very quickly.  Your heart is beating very quickly.  Your skin feels cold and clammy.  You feel confused.  You have pain when you pee.  You have signs of dehydration, such as: ? Dark pee, hardly any pee, or no pee. ? Cracked lips. ? Dry mouth. ? Sunken eyes. ? Sleepiness. ? Weakness. This information is not intended to replace advice given to you by your health care provider. Make sure you discuss any questions you have with your health care provider. Document Released: 02/15/2008 Document Revised: 03/18/2016 Document Reviewed: 05/05/2015 Elsevier Interactive Patient Education  2017 [employer] Food Choices to Help Relieve Diarrhea, Adult When you have diarrhea, the foods you eat and your eating habits are very important. Choosing the right foods and drinks can   help:  Relieve diarrhea.  Replace lost fluids and nutrients.  Prevent dehydration.  What general guidelines should I follow? Relieving diarrhea  Choose foods with less than 2 g or .07 oz. of fiber per serving.  Limit fats to less than 8 tsp (38 g or 1.34 oz.) a day.  Avoid the following: ? Foods and beverages sweetened with high-fructose corn syrup, honey, or sugar alcohols such as xylitol, sorbitol, and mannitol. ? Foods that contain a lot of fat or  sugar. ? Fried, greasy, or spicy foods. ? High-fiber grains, breads, and cereals. ? Raw fruits and vegetables.  Eat foods that are rich in probiotics. These foods include dairy products such as yogurt and fermented milk products. They help increase healthy bacteria in the stomach and intestines (gastrointestinal tract, or GI tract).  If you have lactose intolerance, avoid dairy products. These may make your diarrhea worse.  Take medicine to help stop diarrhea (antidiarrheal medicine) only as told by your health care provider. Replacing nutrients  Eat small meals or snacks every 3-4 hours.  Eat bland foods, such as white rice, toast, or baked potato, until your diarrhea starts to get better. Gradually reintroduce nutrient-rich foods as tolerated or as told by your health care provider. This includes: ? Well-cooked protein foods. ? Peeled, seeded, and soft-cooked fruits and vegetables. ? Low-fat dairy products.  Take vitamin and mineral supplements as told by your health care provider. Preventing dehydration   Start by sipping water or a special solution to prevent dehydration (oral rehydration solution, ORS). Urine that is clear or pale yellow means that you are getting enough fluid.  Try to drink at least 8-10 cups of fluid each day to help replace lost fluids.  You may add other liquids in addition to water, such as clear juice or decaffeinated sports drinks, as tolerated or as told by your health care provider.  Avoid drinks with caffeine, such as coffee, tea, or soft drinks.  Avoid alcohol. What foods are recommended? The items listed may not be a complete list. Talk with your health care provider about what dietary choices are best for you. Grains White rice. White, French, or pita breads (fresh or toasted), including plain rolls, buns, or bagels. White pasta. Saltine, soda, or graham crackers. Pretzels. Low-fiber cereal. Cooked cereals made with water (such as cornmeal,  farina, or cream cereals). Plain muffins. Matzo. Melba toast. Zwieback. Vegetables Potatoes (without the skin). Most well-cooked and canned vegetables without skins or seeds. Tender lettuce. Fruits Apple sauce. Fruits canned in juice. Cooked apricots, cherries, grapefruit, peaches, pears, or plums. Fresh bananas and cantaloupe. Meats and other protein foods Baked or boiled chicken. Eggs. Tofu. Fish. Seafood. Smooth nut butters. Ground or well-cooked tender beef, ham, veal, lamb, pork, or poultry. Dairy Plain yogurt, kefir, and unsweetened liquid yogurt. Lactose-free milk, buttermilk, skim milk, or soy milk. Low-fat or nonfat hard cheese. Beverages Water. Low-calorie sports drinks. Fruit juices without pulp. Strained tomato and vegetable juices. Decaffeinated teas. Sugar-free beverages not sweetened with sugar alcohols. Oral rehydration solutions, if approved by your health care provider. Seasoning and other foods Bouillon, broth, or soups made from recommended foods. What foods are not recommended? The items listed may not be a complete list. Talk with your health care provider about what dietary choices are best for you. Grains Whole grain, whole wheat, bran, or rye breads, rolls, pastas, and crackers. Wild or brown rice. Whole grain or bran cereals. Barley. Oats and oatmeal. Corn tortillas or taco shells. Granola. Popcorn.   Vegetables Raw vegetables. Fried vegetables. Cabbage, broccoli, Brussels sprouts, artichokes, baked beans, beet greens, corn, kale, legumes, peas, sweet potatoes, and yams. Potato skins. Cooked spinach and cabbage. Fruits Dried fruit, including raisins and dates. Raw fruits. Stewed or dried prunes. Canned fruits with syrup. Meat and other protein foods Fried or fatty meats. Deli meats. Chunky nut butters. Nuts and seeds. Beans and lentils. Bacon. Hot dogs. Sausage. Dairy High-fat cheeses. Whole milk, chocolate milk, and beverages made with milk, such as milk shakes.  Half-and-half. Cream. sour cream. Ice cream. Beverages Caffeinated beverages (such as coffee, tea, soda, or energy drinks). Alcoholic beverages. Fruit juices with pulp. Prune juice. Soft drinks sweetened with high-fructose corn syrup or sugar alcohols. High-calorie sports drinks. Fats and oils Butter. Cream sauces. Margarine. Salad oils. Plain salad dressings. Olives. Avocados. Mayonnaise. Sweets and desserts Sweet rolls, doughnuts, and sweet breads. Sugar-free desserts sweetened with sugar alcohols such as xylitol and sorbitol. Seasoning and other foods Honey. Hot sauce. Chili powder. Gravy. Cream-based or milk-based soups. Pancakes and waffles. Summary  When you have diarrhea, the foods you eat and your eating habits are very important.  Make sure you get at least 8-10 cups of fluid each day, or enough to keep your urine clear or pale yellow.  Eat bland foods and gradually reintroduce healthy, nutrient-rich foods as tolerated, or as told by your health care provider.  Avoid high-fiber, fried, greasy, or spicy foods. This information is not intended to replace advice given to you by your health care provider. Make sure you discuss any questions you have with your health care provider. Document Released: 11/19/2003 Document Revised: 08/26/2016 Document Reviewed: 08/26/2016 Elsevier Interactive Patient Education  2017 [employer]vier Inc[employer]  

## 2017-03-24 LAB — CULTURE, OB URINE

## 2017-03-27 ENCOUNTER — Inpatient Hospital Stay (HOSPITAL_COMMUNITY)
Admission: AD | Admit: 2017-03-27 | Discharge: 2017-03-27 | Disposition: A | Discharge status: Home / Self Care | Payer: Federal, State, Local not specified - PPO | Source: Ambulatory Visit | Attending: Obstetrics and Gynecology | Admitting: Obstetrics and Gynecology

## 2017-03-27 ENCOUNTER — Encounter (HOSPITAL_COMMUNITY): Payer: Self-pay | Admitting: Obstetrics and Gynecology

## 2017-03-27 DIAGNOSIS — K529 Noninfective gastroenteritis and colitis, unspecified: Secondary | ICD-10-CM | POA: Diagnosis not present

## 2017-03-27 DIAGNOSIS — O9989 Other specified diseases and conditions complicating pregnancy, childbirth and the puerperium: Secondary | ICD-10-CM

## 2017-03-27 DIAGNOSIS — Z3A38 38 weeks gestation of pregnancy: Secondary | ICD-10-CM | POA: Diagnosis not present

## 2017-03-27 DIAGNOSIS — O99613 Diseases of the digestive system complicating pregnancy, third trimester: Secondary | ICD-10-CM | POA: Diagnosis not present

## 2017-03-27 DIAGNOSIS — O212 Late vomiting of pregnancy: Secondary | ICD-10-CM | POA: Diagnosis present

## 2017-03-27 LAB — URINALYSIS, ROUTINE W REFLEX MICROSCOPIC
Bilirubin Urine: NEGATIVE
GLUCOSE, UA: NEGATIVE mg/dL
HGB URINE DIPSTICK: NEGATIVE
KETONES UR: 80 mg/dL — AB
NITRITE: NEGATIVE
PROTEIN: 30 mg/dL — AB
Specific Gravity, Urine: 1.027 (ref 1.005–1.030)
pH: 5 (ref 5.0–8.0)

## 2017-03-27 MED ORDER — M.V.I. ADULT IV INJ
Freq: Once | INTRAVENOUS | Status: AC
  Administered 2017-03-27: 14:00:00 via INTRAVENOUS
  Filled 2017-03-27: qty 10

## 2017-03-27 MED ORDER — DEXTROSE 5 % IN LACTATED RINGERS IV BOLUS
1000.0000 mL | Freq: Once | INTRAVENOUS | Status: AC
  Administered 2017-03-27: 1000 mL via INTRAVENOUS

## 2017-03-27 MED ORDER — SODIUM CHLORIDE 0.9 % IV SOLN
8.0000 mg | Freq: Once | INTRAVENOUS | Status: AC
  Administered 2017-03-27: 8 mg via INTRAVENOUS
  Filled 2017-03-27: qty 4

## 2017-03-27 NOTE — MAU Note (Signed)
Was here on Thurs, with nausea and diarrhea.  Now is throwing up black/brown stuff and the diarrhea is coming out like water.  Having abd pain. No one else at home is sick

## 2017-03-27 NOTE — MAU Provider Note (Signed)
History     CSN: [id number]  Arrival date and time: 03/27/17 1158   First Provider Initiated Contact with Patient 03/27/17 1251      Chief Complaint  Patient presents with  . Abdominal Pain  . Emesis  . Diarrhea   HPI  Ms. Susan Cross is a 26 yo G2P0010 at 38.[redacted] wks gestation presenting with complaints of N/V/D since Thursday 7/12.  She reports vomiting "black/brown stuff" and now watery diarrhea x 5-6 episodes today; she has had 2 episodes daily.  She was seen in MAU on 7/12 with the same complaints and LOF.  She denies anyone at home or that she has been around has been sick.  She denies VB or LOF today.  She reports good (+) FM today.  She reports feeling weak from having vomiting and diarrhea so many times.  Past Medical History:  Diagnosis Date  . Anemia   . Genital herpes   . Genital herpes    last was early in preg  . Infection    UTI  . Medical history non-contributory     Past Surgical History:  Procedure Laterality Date  . NO PAST SURGERIES      Family History  Problem Relation Age of Onset  . Heart disease Father   . Cancer Maternal Aunt   . Cancer Maternal Grandmother   . Heart disease Paternal Grandmother     Social History  Substance Use Topics  . Smoking status: Never Smoker  . Smokeless tobacco: Never Used  . Alcohol use Yes     Comment: occiasionally not since pregnancy    Allergies: No Known Allergies  Prescriptions Prior to Admission  Medication Sig Dispense Refill Last Dose  . ondansetron (ZOFRAN ODT) 4 MG disintegrating tablet Take 1 tablet (4 mg total) by mouth every 8 (eight) hours as needed for nausea or vomiting. 20 tablet 0   . Prenatal Vit-Min-FA-Fish Oil ([facility] PRENATAL GUMMY PO) Take by mouth.   Past Week at Unknown time  . valACYclovir (VALTREX) 500 MG tablet Take 500 mg by mouth daily.   Past Week at Unknown time    Review of Systems  Constitutional: Positive for fatigue (since Thursday).  Eyes: Negative.   Respiratory:  Negative.   Gastrointestinal: Positive for abdominal pain, diarrhea (watery; 5-6 episodes today; usually 2 episodes daily since start), nausea and vomiting (since Thursday).  Endocrine: Negative.   Genitourinary: Negative.  Negative for pelvic pain, vaginal bleeding and vaginal discharge.  Musculoskeletal: Negative.   Skin: Negative.   Allergic/Immunologic: Negative.   Neurological: Negative.   Hematological: Negative.   Psychiatric/Behavioral: Negative.    Physical Exam   Blood pressure 133/85, pulse (!) 111, temperature 98.5 F (36.9 C), temperature source Oral, resp. rate 16, weight 80.6 kg (177 lb 12 oz), last menstrual period 07/04/2016, SpO2 99 %.  Physical Exam  Constitutional: She is oriented to person, place, and time. She appears well-developed and well-nourished.  HENT:  Head: Normocephalic.  Eyes: Pupils are equal, round, and reactive to light.  Neck: Normal range of motion.  Cardiovascular: Normal rate, regular rhythm, normal heart sounds and intact distal pulses.   Respiratory: Effort normal and breath sounds normal.  GI: Soft. Bowel sounds are normal.  Genitourinary:  Genitourinary Comments: Uterus: gravid, soft, non-tender, S=D, cx; closed/long/firm  Musculoskeletal: Normal range of motion.  Neurological: She is alert and oriented to person, place, and time. She has normal reflexes.  Skin: Skin is warm and dry.  Psychiatric: She has a normal  mood and affect. Her behavior is normal. Judgment and thought content normal.    MAU Course  Procedures  MDM NST - FHR: 150 bpm / moderate variability / accels present / decels absent           TOCO: irregular every 2-7 mins  CCUA D5LR 1000 ml with Zofran 8 mg bolus followed by MVI 10 ml in D5LR 1000 ml *Consult with Dr. Richardson Dopp @ 1527 - notified of patient's complaints, assessments, lab results, tx plan d/c home - ok to d/c home, agrees with plan, have pt pick up Rx for Zofran and take as previously prescribed  Assessment  and Plan  Gastroenteritis - Take Rx for Zofran as previously prescribed - Stay well-hydrated with 8 bottles of water daily - Instructions on gastroenteritis, bland diet and diet for diarrhea - Keep scheduled appt with Dr. Richardson Dopp on 03/30/2017  Discharge home Patient verbalized an understanding of the plan of care and agrees.   Raelyn Mora, MSN, CNM 03/27/2017, 1:09 PM

## 2017-03-27 NOTE — Discharge Instructions (Signed)
Food Choices to Help Relieve Diarrhea, Adult When you have diarrhea, the foods you eat and your eating habits are very important. Choosing the right foods and drinks can help:  Relieve diarrhea.  Replace lost fluids and nutrients.  Prevent dehydration.  What general guidelines should I follow? Relieving diarrhea  Choose foods with less than 2 g or .07 oz. of fiber per serving.  Limit fats to less than 8 tsp (38 g or 1.34 oz.) a day.  Avoid the following: ? Foods and beverages sweetened with high-fructose corn syrup, honey, or sugar alcohols such as xylitol, sorbitol, and mannitol. ? Foods that contain a lot of fat or sugar. ? Fried, greasy, or spicy foods. ? High-fiber grains, breads, and cereals. ? Raw fruits and vegetables.  Eat foods that are rich in probiotics. These foods include dairy products such as yogurt and fermented milk products. They help increase healthy bacteria in the stomach and intestines (gastrointestinal tract, or GI tract).  If you have lactose intolerance, avoid dairy products. These may make your diarrhea worse.  Take medicine to help stop diarrhea (antidiarrheal medicine) only as told by your health care provider. Replacing nutrients  Eat small meals or snacks every 3-4 hours.  Eat bland foods, such as white rice, toast, or baked potato, until your diarrhea starts to get better. Gradually reintroduce nutrient-rich foods as tolerated or as told by your health care provider. This includes: ? Well-cooked protein foods. ? Peeled, seeded, and soft-cooked fruits and vegetables. ? Low-fat dairy products.  Take vitamin and mineral supplements as told by your health care provider. Preventing dehydration   Start by sipping water or a special solution to prevent dehydration (oral rehydration solution, ORS). Urine that is clear or pale yellow means that you are getting enough fluid.  Try to drink at least 8-10 cups of fluid each day to help replace lost  fluids.  You may add other liquids in addition to water, such as clear juice or decaffeinated sports drinks, as tolerated or as told by your health care provider.  Avoid drinks with caffeine, such as coffee, tea, or soft drinks.  Avoid alcohol. What foods are recommended? The items listed may not be a complete list. Talk with your health care provider about what dietary choices are best for you. Grains White rice. White, French, or pita breads (fresh or toasted), including plain rolls, buns, or bagels. White pasta. Saltine, soda, or graham crackers. Pretzels. Low-fiber cereal. Cooked cereals made with water (such as cornmeal, farina, or cream cereals). Plain muffins. Matzo. Melba toast. Zwieback. Vegetables Potatoes (without the skin). Most well-cooked and canned vegetables without skins or seeds. Tender lettuce. Fruits Apple sauce. Fruits canned in juice. Cooked apricots, cherries, grapefruit, peaches, pears, or plums. Fresh bananas and cantaloupe. Meats and other protein foods Baked or boiled chicken. Eggs. Tofu. Fish. Seafood. Smooth nut butters. Ground or well-cooked tender beef, ham, veal, lamb, pork, or poultry. Dairy Plain yogurt, kefir, and unsweetened liquid yogurt. Lactose-free milk, buttermilk, skim milk, or soy milk. Low-fat or nonfat hard cheese. Beverages Water. Low-calorie sports drinks. Fruit juices without pulp. Strained tomato and vegetable juices. Decaffeinated teas. Sugar-free beverages not sweetened with sugar alcohols. Oral rehydration solutions, if approved by your health care provider. Seasoning and other foods Bouillon, broth, or soups made from recommended foods. What foods are not recommended? The items listed may not be a complete list. Talk with your health care provider about what dietary choices are best for you. Grains Whole grain, whole wheat,   bran, or rye breads, rolls, pastas, and crackers. Wild or brown rice. Whole grain or bran cereals. Barley. Oats and  oatmeal. Corn tortillas or taco shells. Granola. Popcorn. Vegetables Raw vegetables. Fried vegetables. Cabbage, broccoli, Brussels sprouts, artichokes, baked beans, beet greens, corn, kale, legumes, peas, sweet potatoes, and yams. Potato skins. Cooked spinach and cabbage. Fruits Dried fruit, including raisins and dates. Raw fruits. Stewed or dried prunes. Canned fruits with syrup. Meat and other protein foods Fried or fatty meats. Deli meats. Chunky nut butters. Nuts and seeds. Beans and lentils. Bacon. Hot dogs. Sausage. Dairy High-fat cheeses. Whole milk, chocolate milk, and beverages made with milk, such as milk shakes. Half-and-half. Cream. sour cream. Ice cream. Beverages Caffeinated beverages (such as coffee, tea, soda, or energy drinks). Alcoholic beverages. Fruit juices with pulp. Prune juice. Soft drinks sweetened with high-fructose corn syrup or sugar alcohols. High-calorie sports drinks. Fats and oils Butter. Cream sauces. Margarine. Salad oils. Plain salad dressings. Olives. Avocados. Mayonnaise. Sweets and desserts Sweet rolls, doughnuts, and sweet breads. Sugar-free desserts sweetened with sugar alcohols such as xylitol and sorbitol. Seasoning and other foods Honey. Hot sauce. Chili powder. Gravy. Cream-based or milk-based soups. Pancakes and waffles. Summary  When you have diarrhea, the foods you eat and your eating habits are very important.  Make sure you get at least 8-10 cups of fluid each day, or enough to keep your urine clear or pale yellow.  Eat bland foods and gradually reintroduce healthy, nutrient-rich foods as tolerated, or as told by your health care provider.  Avoid high-fiber, fried, greasy, or spicy foods. This information is not intended to replace advice given to you by your health care provider. Make sure you discuss any questions you have with your health care provider. Document Released: 11/19/2003 Document Revised: 08/26/2016 Document Reviewed:  08/26/2016 Elsevier Interactive Patient Education  2017 [employer] Bland Diet A bland diet consists of foods that do not have a lot of fat or fiber. Foods without fat or fiber are easier for the body to digest. They are also less likely to irritate your mouth, throat, stomach, and other parts of your gastrointestinal tract. A bland diet is sometimes called a BRAT diet. What is my plan? Your health care provider or dietitian may recommend specific changes to your diet to prevent and treat your symptoms, such as:  Eating small meals often.  Cooking food until it is soft enough to chew easily.  Chewing your food well.  Drinking fluids slowly.  Not eating foods that are very spicy, sour, or fatty.  Not eating citrus fruits, such as oranges and grapefruit.  What do I need to know about this diet?  Eat a variety of foods from the bland diet food list.  Do not follow a bland diet longer than you have to.  Ask your health care provider whether you should take vitamins. What foods can I eat? Grains  Hot cereals, such as cream of wheat. Bread, crackers, or tortillas made from refined white flour. Rice. Vegetables Canned or cooked vegetables. Mashed or boiled potatoes. Fruits Bananas. Applesauce. Other types of cooked or canned fruit with the skin and seeds removed, such as canned peaches or pears. Meats and Other Protein Sources Scrambled eggs. Creamy peanut butter or other nut butters. Lean, well-cooked meats, such as chicken or fish. Tofu. Soups or broths. Dairy Low-fat dairy products, such as milk, cottage cheese, or yogurt. Beverages Water. Herbal tea. Apple juice. Sweets and Desserts Pudding. Custard. Fruit gelatin. Ice cream. Fats   and Oils Mild salad dressings. Canola or olive oil. The items listed above may not be a complete list of allowed foods or beverages. Contact your dietitian for more options. What foods are not recommended? Foods and ingredients that are often  not recommended include:  Spicy foods, such as hot sauce or salsa.  Fried foods.  Sour foods, such as pickled or fermented foods.  Raw vegetables or fruits, especially citrus or berries.  Caffeinated drinks.  Alcohol.  Strongly flavored seasonings or condiments.  The items listed above may not be a complete list of foods and beverages that are not allowed. Contact your dietitian for more information. This information is not intended to replace advice given to you by your health care provider. Make sure you discuss any questions you have with your health care provider. Document Released: 12/21/2015 Document Revised: 02/04/2016 Document Reviewed: 09/10/2014 Elsevier Interactive Patient Education  2018 Elsevier Inc.  

## 2017-04-06 DIAGNOSIS — N9089 Other specified noninflammatory disorders of vulva and perineum: Secondary | ICD-10-CM | POA: Diagnosis not present

## 2017-04-13 ENCOUNTER — Telehealth (HOSPITAL_COMMUNITY): Payer: Self-pay | Admitting: *Deleted

## 2017-04-13 NOTE — Telephone Encounter (Signed)
Preadmission screen  

## 2017-04-14 ENCOUNTER — Inpatient Hospital Stay (HOSPITAL_COMMUNITY)
Admission: AD | Admit: 2017-04-14 | Discharge: 2017-04-14 | Disposition: A | Discharge status: Home / Self Care | Payer: Federal, State, Local not specified - PPO | Source: Ambulatory Visit | Attending: Obstetrics & Gynecology | Admitting: Obstetrics & Gynecology

## 2017-04-14 ENCOUNTER — Inpatient Hospital Stay (HOSPITAL_COMMUNITY): Payer: Federal, State, Local not specified - PPO | Admitting: Certified Registered Nurse Anesthetist

## 2017-04-14 ENCOUNTER — Encounter (HOSPITAL_COMMUNITY)
Admission: AD | Disposition: A | Discharge status: Home / Self Care | Payer: Self-pay | Source: Ambulatory Visit | Attending: Obstetrics & Gynecology

## 2017-04-14 ENCOUNTER — Encounter (HOSPITAL_COMMUNITY): Payer: Self-pay

## 2017-04-14 ENCOUNTER — Inpatient Hospital Stay (HOSPITAL_COMMUNITY)
Admission: AD | Admit: 2017-04-14 | Discharge: 2017-04-17 | DRG: 766 | Disposition: A | Discharge status: Home / Self Care | Payer: Federal, State, Local not specified - PPO | Source: Ambulatory Visit | Attending: Obstetrics & Gynecology | Admitting: Obstetrics & Gynecology

## 2017-04-14 DIAGNOSIS — O471 False labor at or after 37 completed weeks of gestation: Secondary | ICD-10-CM

## 2017-04-14 DIAGNOSIS — Z3A40 40 weeks gestation of pregnancy: Secondary | ICD-10-CM

## 2017-04-14 DIAGNOSIS — A6000 Herpesviral infection of urogenital system, unspecified: Secondary | ICD-10-CM | POA: Diagnosis present

## 2017-04-14 DIAGNOSIS — O98519 Other viral diseases complicating pregnancy, unspecified trimester: Secondary | ICD-10-CM

## 2017-04-14 DIAGNOSIS — B009 Herpesviral infection, unspecified: Secondary | ICD-10-CM | POA: Diagnosis not present

## 2017-04-14 DIAGNOSIS — Z98891 History of uterine scar from previous surgery: Secondary | ICD-10-CM

## 2017-04-14 DIAGNOSIS — O99824 Streptococcus B carrier state complicating childbirth: Secondary | ICD-10-CM | POA: Diagnosis not present

## 2017-04-14 DIAGNOSIS — R109 Unspecified abdominal pain: Secondary | ICD-10-CM | POA: Diagnosis not present

## 2017-04-14 DIAGNOSIS — Z3A00 Weeks of gestation of pregnancy not specified: Secondary | ICD-10-CM | POA: Diagnosis not present

## 2017-04-14 DIAGNOSIS — O2643 Herpes gestationis, third trimester: Secondary | ICD-10-CM | POA: Diagnosis not present

## 2017-04-14 DIAGNOSIS — O9832 Other infections with a predominantly sexual mode of transmission complicating childbirth: Principal | ICD-10-CM | POA: Diagnosis present

## 2017-04-14 DIAGNOSIS — Z3A41 41 weeks gestation of pregnancy: Secondary | ICD-10-CM | POA: Diagnosis not present

## 2017-04-14 DIAGNOSIS — Z3493 Encounter for supervision of normal pregnancy, unspecified, third trimester: Secondary | ICD-10-CM

## 2017-04-14 DIAGNOSIS — O98513 Other viral diseases complicating pregnancy, third trimester: Secondary | ICD-10-CM | POA: Diagnosis not present

## 2017-04-14 LAB — CBC
HCT: 33 % — ABNORMAL LOW (ref 36.0–46.0)
Hemoglobin: 10.7 g/dL — ABNORMAL LOW (ref 12.0–15.0)
MCH: 24.3 pg — ABNORMAL LOW (ref 26.0–34.0)
MCHC: 32.4 g/dL (ref 30.0–36.0)
MCV: 74.8 fL — ABNORMAL LOW (ref 78.0–100.0)
PLATELETS: 304 10*3/uL (ref 150–400)
RBC: 4.41 MIL/uL (ref 3.87–5.11)
RDW: 14.1 % (ref 11.5–15.5)
WBC: 8.8 10*3/uL (ref 4.0–10.5)

## 2017-04-14 LAB — URINALYSIS, ROUTINE W REFLEX MICROSCOPIC
BILIRUBIN URINE: NEGATIVE
Glucose, UA: NEGATIVE mg/dL
KETONES UR: NEGATIVE mg/dL
Leukocytes, UA: NEGATIVE
Nitrite: NEGATIVE
PH: 6 (ref 5.0–8.0)
PROTEIN: NEGATIVE mg/dL
SPECIFIC GRAVITY, URINE: 1.008 (ref 1.005–1.030)

## 2017-04-14 LAB — TYPE AND SCREEN
ABO/RH(D): O POS
ANTIBODY SCREEN: NEGATIVE

## 2017-04-14 LAB — COMPREHENSIVE METABOLIC PANEL
ALT: 13 U/L — ABNORMAL LOW (ref 14–54)
AST: 22 U/L (ref 15–41)
Albumin: 3 g/dL — ABNORMAL LOW (ref 3.5–5.0)
Alkaline Phosphatase: 136 U/L — ABNORMAL HIGH (ref 38–126)
Anion gap: 9 (ref 5–15)
BUN: 9 mg/dL (ref 6–20)
CALCIUM: 8.9 mg/dL (ref 8.9–10.3)
CHLORIDE: 107 mmol/L (ref 101–111)
CO2: 19 mmol/L — ABNORMAL LOW (ref 22–32)
CREATININE: 0.71 mg/dL (ref 0.44–1.00)
Glucose, Bld: 78 mg/dL (ref 65–99)
Potassium: 3.8 mmol/L (ref 3.5–5.1)
Sodium: 135 mmol/L (ref 135–145)
Total Bilirubin: 0.7 mg/dL (ref 0.3–1.2)
Total Protein: 6.6 g/dL (ref 6.5–8.1)

## 2017-04-14 LAB — PROTEIN / CREATININE RATIO, URINE: CREATININE, URINE: 66 mg/dL

## 2017-04-14 SURGERY — Surgical Case
Anesthesia: Spinal

## 2017-04-14 MED ORDER — PHENYLEPHRINE 8 MG IN D5W 100 ML (0.08MG/ML) PREMIX OPTIME
INJECTION | INTRAVENOUS | Status: DC | PRN
  Administered 2017-04-14: 60 ug/min via INTRAVENOUS

## 2017-04-14 MED ORDER — BUTORPHANOL TARTRATE 1 MG/ML IJ SOLN
1.0000 mg | Freq: Once | INTRAMUSCULAR | Status: AC
  Administered 2017-04-14: 1 mg via INTRAVENOUS
  Filled 2017-04-14: qty 1

## 2017-04-14 MED ORDER — ZOLPIDEM TARTRATE 5 MG PO TABS: 5.0000 mg | ORAL_TABLET | Freq: Every evening | ORAL | Status: DC | PRN

## 2017-04-14 MED ORDER — LACTATED RINGERS IV BOLUS (SEPSIS)
1000.0000 mL | Freq: Once | INTRAVENOUS | Status: AC
  Administered 2017-04-14: 1000 mL via INTRAVENOUS

## 2017-04-14 MED ORDER — SIMETHICONE 80 MG PO CHEW
80.0000 mg | CHEWABLE_TABLET | ORAL | Status: DC | PRN
  Filled 2017-04-14: qty 1

## 2017-04-14 MED ORDER — FENTANYL CITRATE (PF) 100 MCG/2ML IJ SOLN
INTRAMUSCULAR | Status: AC
  Filled 2017-04-14: qty 2

## 2017-04-14 MED ORDER — DIBUCAINE 1 % RE OINT: 1.0000 "application " | TOPICAL_OINTMENT | RECTAL | Status: DC | PRN

## 2017-04-14 MED ORDER — MORPHINE SULFATE (PF) 0.5 MG/ML IJ SOLN
INTRAMUSCULAR | Status: DC | PRN
  Administered 2017-04-14: .2 mg via INTRATHECAL

## 2017-04-14 MED ORDER — PHENYLEPHRINE 8 MG IN D5W 100 ML (0.08MG/ML) PREMIX OPTIME
INJECTION | INTRAVENOUS | Status: AC
  Filled 2017-04-14: qty 100

## 2017-04-14 MED ORDER — FENTANYL CITRATE (PF) 100 MCG/2ML IJ SOLN
INTRAMUSCULAR | Status: DC | PRN
  Administered 2017-04-14: 10 ug via INTRATHECAL

## 2017-04-14 MED ORDER — MEPERIDINE HCL 25 MG/ML IJ SOLN: 6.2500 mg | INTRAMUSCULAR | Status: DC | PRN

## 2017-04-14 MED ORDER — SOD CITRATE-CITRIC ACID 500-334 MG/5ML PO SOLN
30.0000 mL | Freq: Once | ORAL | Status: AC
  Administered 2017-04-14: 30 mL via ORAL
  Filled 2017-04-14: qty 15

## 2017-04-14 MED ORDER — DIPHENHYDRAMINE HCL 25 MG PO CAPS: 25.0000 mg | ORAL_CAPSULE | Freq: Four times a day (QID) | ORAL | Status: DC | PRN

## 2017-04-14 MED ORDER — DIPHENHYDRAMINE HCL 50 MG/ML IJ SOLN
INTRAMUSCULAR | Status: AC
  Filled 2017-04-14: qty 1

## 2017-04-14 MED ORDER — SIMETHICONE 80 MG PO CHEW
80.0000 mg | CHEWABLE_TABLET | Freq: Three times a day (TID) | ORAL | Status: DC
  Administered 2017-04-15 – 2017-04-17 (×7): 80 mg via ORAL
  Filled 2017-04-14 (×7): qty 1

## 2017-04-14 MED ORDER — DIPHENHYDRAMINE HCL 50 MG/ML IJ SOLN
12.5000 mg | Freq: Once | INTRAMUSCULAR | Status: AC
  Administered 2017-04-14: 12.5 mg via INTRAVENOUS

## 2017-04-14 MED ORDER — MIDAZOLAM HCL 2 MG/2ML IJ SOLN: 0.5000 mg | Freq: Once | INTRAMUSCULAR | Status: DC | PRN

## 2017-04-14 MED ORDER — OXYCODONE HCL 5 MG PO TABS
10.0000 mg | ORAL_TABLET | ORAL | Status: DC | PRN
  Administered 2017-04-15 – 2017-04-16 (×3): 10 mg via ORAL
  Filled 2017-04-14 (×3): qty 2

## 2017-04-14 MED ORDER — PROMETHAZINE HCL 25 MG/ML IJ SOLN: 6.2500 mg | INTRAMUSCULAR | Status: DC | PRN

## 2017-04-14 MED ORDER — OXYCODONE HCL 5 MG PO TABS
5.0000 mg | ORAL_TABLET | ORAL | Status: DC | PRN
  Administered 2017-04-15 – 2017-04-17 (×7): 5 mg via ORAL
  Filled 2017-04-14 (×7): qty 1

## 2017-04-14 MED ORDER — DIPHENHYDRAMINE HCL 50 MG/ML IJ SOLN
INTRAMUSCULAR | Status: AC
  Administered 2017-04-14: 12.5 mg via INTRAVENOUS
  Filled 2017-04-14: qty 1

## 2017-04-14 MED ORDER — ONDANSETRON HCL 4 MG/2ML IJ SOLN
INTRAMUSCULAR | Status: DC | PRN
  Administered 2017-04-14: 4 mg via INTRAVENOUS

## 2017-04-14 MED ORDER — MENTHOL 3 MG MT LOZG: 1.0000 | LOZENGE | OROMUCOSAL | Status: DC | PRN

## 2017-04-14 MED ORDER — IBUPROFEN 600 MG PO TABS
600.0000 mg | ORAL_TABLET | Freq: Four times a day (QID) | ORAL | Status: DC
  Administered 2017-04-15 – 2017-04-17 (×10): 600 mg via ORAL
  Filled 2017-04-14 (×10): qty 1

## 2017-04-14 MED ORDER — SCOPOLAMINE 1 MG/3DAYS TD PT72
MEDICATED_PATCH | TRANSDERMAL | Status: AC
  Filled 2017-04-14: qty 1

## 2017-04-14 MED ORDER — LACTATED RINGERS IV SOLN
INTRAVENOUS | Status: DC | PRN
  Administered 2017-04-14: 19:00:00 via INTRAVENOUS

## 2017-04-14 MED ORDER — DIPHENHYDRAMINE HCL 50 MG/ML IJ SOLN
12.5000 mg | INTRAMUSCULAR | Status: DC | PRN
  Administered 2017-04-14: 12.5 mg via INTRAVENOUS
  Filled 2017-04-14: qty 1

## 2017-04-14 MED ORDER — OXYTOCIN 10 UNIT/ML IJ SOLN
INTRAMUSCULAR | Status: AC
  Filled 2017-04-14: qty 4

## 2017-04-14 MED ORDER — OXYTOCIN 40 UNITS IN LACTATED RINGERS INFUSION - SIMPLE MED: 2.5000 [IU]/h | INTRAVENOUS | Status: AC

## 2017-04-14 MED ORDER — BUPIVACAINE IN DEXTROSE 0.75-8.25 % IT SOLN
INTRATHECAL | Status: AC
  Filled 2017-04-14: qty 2

## 2017-04-14 MED ORDER — MORPHINE SULFATE (PF) 0.5 MG/ML IJ SOLN
INTRAMUSCULAR | Status: AC
  Filled 2017-04-14: qty 10

## 2017-04-14 MED ORDER — METOCLOPRAMIDE HCL 5 MG/ML IJ SOLN
INTRAMUSCULAR | Status: AC
Start: 2017-04-14 — End: 2017-04-14
  Filled 2017-04-14: qty 2

## 2017-04-14 MED ORDER — MEPERIDINE HCL 25 MG/ML IJ SOLN
INTRAMUSCULAR | Status: DC | PRN
  Administered 2017-04-14: 12.5 mg via INTRAVENOUS

## 2017-04-14 MED ORDER — DIPHENHYDRAMINE HCL 50 MG/ML IJ SOLN
INTRAMUSCULAR | Status: DC | PRN
Start: 2017-04-14 — End: 2017-04-14
  Administered 2017-04-14: 12.5 mg via INTRAVENOUS

## 2017-04-14 MED ORDER — LACTATED RINGERS IV SOLN
INTRAVENOUS | Status: DC
Start: 2017-04-14 — End: 2017-04-14
  Administered 2017-04-14: 04:00:00 via INTRAVENOUS

## 2017-04-14 MED ORDER — MEPERIDINE HCL 25 MG/ML IJ SOLN
INTRAMUSCULAR | Status: AC
  Filled 2017-04-14: qty 1

## 2017-04-14 MED ORDER — CEFAZOLIN SODIUM-DEXTROSE 2-4 GM/100ML-% IV SOLN
2.0000 g | INTRAVENOUS | Status: AC
  Administered 2017-04-14: 2 g via INTRAVENOUS
  Filled 2017-04-14: qty 100

## 2017-04-14 MED ORDER — OXYCODONE-ACETAMINOPHEN 5-325 MG PO TABS
1.0000 | ORAL_TABLET | Freq: Once | ORAL | Status: AC
  Administered 2017-04-14: 1 via ORAL
  Filled 2017-04-14: qty 1

## 2017-04-14 MED ORDER — SIMETHICONE 80 MG PO CHEW
80.0000 mg | CHEWABLE_TABLET | ORAL | Status: DC
  Administered 2017-04-15 – 2017-04-16 (×2): 80 mg via ORAL
  Filled 2017-04-14: qty 1

## 2017-04-14 MED ORDER — MORPHINE SULFATE (PF) 4 MG/ML IV SOLN: 1.0000 mg | INTRAVENOUS | Status: DC | PRN

## 2017-04-14 MED ORDER — PROMETHAZINE HCL 25 MG/ML IJ SOLN
25.0000 mg | Freq: Once | INTRAMUSCULAR | Status: AC
  Administered 2017-04-14: 25 mg via INTRAVENOUS
  Filled 2017-04-14: qty 1

## 2017-04-14 MED ORDER — WITCH HAZEL-GLYCERIN EX PADS: 1.0000 "application " | MEDICATED_PAD | CUTANEOUS | Status: DC | PRN

## 2017-04-14 MED ORDER — SCOPOLAMINE 1 MG/3DAYS TD PT72
MEDICATED_PATCH | TRANSDERMAL | Status: DC | PRN
  Administered 2017-04-14: 1 via TRANSDERMAL

## 2017-04-14 MED ORDER — ONDANSETRON HCL 4 MG/2ML IJ SOLN
INTRAMUSCULAR | Status: AC
  Filled 2017-04-14: qty 2

## 2017-04-14 MED ORDER — FAMOTIDINE IN NACL 20-0.9 MG/50ML-% IV SOLN
20.0000 mg | Freq: Once | INTRAVENOUS | Status: DC
  Filled 2017-04-14: qty 50

## 2017-04-14 MED ORDER — ACETAMINOPHEN 325 MG PO TABS
650.0000 mg | ORAL_TABLET | ORAL | Status: DC | PRN
  Administered 2017-04-15 – 2017-04-16 (×3): 650 mg via ORAL
  Administered 2017-04-17: 325 mg via ORAL
  Filled 2017-04-14 (×4): qty 2

## 2017-04-14 MED ORDER — OXYTOCIN 10 UNIT/ML IJ SOLN
INTRAVENOUS | Status: DC | PRN
  Administered 2017-04-14: 40 [IU] via INTRAVENOUS

## 2017-04-14 MED ORDER — LACTATED RINGERS IV SOLN
INTRAVENOUS | Status: DC
  Administered 2017-04-14 (×2): via INTRAVENOUS

## 2017-04-14 MED ORDER — COCONUT OIL OIL
1.0000 "application " | TOPICAL_OIL | Status: DC | PRN
  Filled 2017-04-14: qty 120

## 2017-04-14 MED ORDER — PRENATAL MULTIVITAMIN CH
1.0000 | ORAL_TABLET | Freq: Every day | ORAL | Status: DC
  Administered 2017-04-15 – 2017-04-17 (×3): 1 via ORAL
  Filled 2017-04-14 (×3): qty 1

## 2017-04-14 MED ORDER — SENNOSIDES-DOCUSATE SODIUM 8.6-50 MG PO TABS
2.0000 | ORAL_TABLET | ORAL | Status: DC
  Administered 2017-04-15 – 2017-04-16 (×2): 2 via ORAL
  Filled 2017-04-14 (×2): qty 2

## 2017-04-14 MED ORDER — METOCLOPRAMIDE HCL 5 MG/ML IJ SOLN
INTRAMUSCULAR | Status: DC | PRN
  Administered 2017-04-14: 10 mg via INTRAVENOUS

## 2017-04-14 MED ORDER — BUPIVACAINE IN DEXTROSE 0.75-8.25 % IT SOLN
INTRATHECAL | Status: DC | PRN
  Administered 2017-04-14: 1.2 mL via INTRATHECAL

## 2017-04-14 MED ORDER — KETOROLAC TROMETHAMINE 30 MG/ML IJ SOLN
30.0000 mg | Freq: Once | INTRAMUSCULAR | Status: AC
  Administered 2017-04-14: 30 mg via INTRAVENOUS
  Filled 2017-04-14: qty 1

## 2017-04-14 MED ORDER — LACTATED RINGERS IV SOLN
INTRAVENOUS | Status: DC
  Administered 2017-04-15: 06:00:00 via INTRAVENOUS

## 2017-04-14 SURGICAL SUPPLY — 44 items
ADH SKN CLS APL DERMABOND .7 (GAUZE/BANDAGES/DRESSINGS)
APL SKNCLS STERI-STRIP NONHPOA (GAUZE/BANDAGES/DRESSINGS) ×1
BARRIER ADHS 3X4 INTERCEED (GAUZE/BANDAGES/DRESSINGS) ×2 IMPLANT
BENZOIN TINCTURE PRP APPL 2/3 (GAUZE/BANDAGES/DRESSINGS) ×3 IMPLANT
BRR ADH 4X3 ABS CNTRL BYND (GAUZE/BANDAGES/DRESSINGS) ×1
CHLORAPREP W/TINT 26ML (MISCELLANEOUS) ×3 IMPLANT
CLAMP CORD UMBIL (MISCELLANEOUS) IMPLANT
CLOSURE WOUND 1/2 X4 (GAUZE/BANDAGES/DRESSINGS) ×1
CLOTH BEACON ORANGE TIMEOUT ST (SAFETY) ×3 IMPLANT
DERMABOND ADVANCED (GAUZE/BANDAGES/DRESSINGS)
DERMABOND ADVANCED .7 DNX12 (GAUZE/BANDAGES/DRESSINGS) IMPLANT
DRSG OPSITE POSTOP 4X10 (GAUZE/BANDAGES/DRESSINGS) ×3 IMPLANT
ELECT REM PT RETURN 9FT ADLT (ELECTROSURGICAL) ×3
ELECTRODE REM PT RTRN 9FT ADLT (ELECTROSURGICAL) ×1 IMPLANT
EXTRACTOR VACUUM KIWI (MISCELLANEOUS) IMPLANT
GLOVE BIOGEL PI IND STRL 6.5 (GLOVE) ×1 IMPLANT
GLOVE BIOGEL PI IND STRL 7.0 (GLOVE) ×2 IMPLANT
GLOVE BIOGEL PI INDICATOR 6.5 (GLOVE) ×2
GLOVE BIOGEL PI INDICATOR 7.0 (GLOVE) ×4
GLOVE ECLIPSE 6.5 STRL STRAW (GLOVE) ×3 IMPLANT
GOWN STRL REUS W/TWL LRG LVL3 (GOWN DISPOSABLE) ×9 IMPLANT
KIT ABG SYR 3ML LUER SLIP (SYRINGE) IMPLANT
NDL HYPO 25X5/8 SAFETYGLIDE (NEEDLE) IMPLANT
NEEDLE HYPO 25X5/8 SAFETYGLIDE (NEEDLE) IMPLANT
NS IRRIG 1000ML POUR BTL (IV SOLUTION) ×3 IMPLANT
PACK C SECTION WH (CUSTOM PROCEDURE TRAY) ×3 IMPLANT
PAD ABD 7.5X8 STRL (GAUZE/BANDAGES/DRESSINGS) ×3 IMPLANT
PAD OB MATERNITY 4.3X12.25 (PERSONAL CARE ITEMS) ×3 IMPLANT
PENCIL SMOKE EVAC W/HOLSTER (ELECTROSURGICAL) ×3 IMPLANT
RTRCTR C-SECT PINK 25CM LRG (MISCELLANEOUS) ×3 IMPLANT
STRIP CLOSURE SKIN 1/2X4 (GAUZE/BANDAGES/DRESSINGS) ×2 IMPLANT
SUT PLAIN 0 NONE (SUTURE) IMPLANT
SUT PLAIN 2 0 (SUTURE) ×3
SUT PLAIN 2 0 XLH (SUTURE) IMPLANT
SUT PLAIN ABS 2-0 CT1 27XMFL (SUTURE) IMPLANT
SUT VIC AB 0 CT1 27 (SUTURE) ×9
SUT VIC AB 0 CT1 27XBRD ANBCTR (SUTURE) ×2 IMPLANT
SUT VIC AB 0 CTX 36 (SUTURE) ×9
SUT VIC AB 0 CTX36XBRD ANBCTRL (SUTURE) ×3 IMPLANT
SUT VIC AB 2-0 CT1 27 (SUTURE) ×3
SUT VIC AB 2-0 CT1 TAPERPNT 27 (SUTURE) ×1 IMPLANT
SUT VIC AB 4-0 KS 27 (SUTURE) ×3 IMPLANT
TOWEL OR 17X24 6PK STRL BLUE (TOWEL DISPOSABLE) ×3 IMPLANT
TRAY FOLEY BAG SILVER LF 14FR (SET/KITS/TRAYS/PACK) IMPLANT

## 2017-04-14 NOTE — MAU Note (Signed)
CTX tonight, called Dr at 2000 and was told to come in when CTX closer together.  Earlier about 8 mins apart, now unsure.  No LOF/VB.  Reports good fetal movement.  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  Unsure whether still a concern or not.

## 2017-04-14 NOTE — Op Note (Signed)
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 Myna HidalgoJennifer Jaimere Feutz Anesthesia: spinal Complications: none EBL: 900cc UOP: 100cc Fluids: 1600cc  Findings: Female infant from OP presentation, normal uterus tubes and ovaries bilaterally.  PROCEDURE:  Informed consent was obtained from the patient with risks, benefits, complications, treatment options, and expected outcomes discussed with the patient.  The patient concurred with the proposed plan, giving informed consent with form signed.   The patient was taken to Operating Room, and identified with the procedure verified as C-Section Delivery with Time Out. With induction of anesthesia, the patient was prepped and draped in the usual sterile fashion. A Pfannenstiel incision was made and carried down through the subcutaneous tissue to the fascia. The fascia was incised in the midline and extended transversely. The superior aspect of the fascial incision was grasped with Kochers elevated and the underlying muscle dissected off. The inferior aspect of the facial incision was in similar fashion, grasped elevated and rectus muscles dissected off. The peritoneum was identified and entered. Peritoneal incision was extended longitudinally. Alexis retractor was placed.  The utero-vesical peritoneal reflection was identified and incised transversely with the [facility] scissors, the incision extended laterally, the bladder flap created digitally. A low transverse uterine incision was made and the infants head delivered atraumatically. After the umbilical cord was clamped and cut cord blood was obtained for evaluation.   The placenta was removed intact and appeared normal. The uterine outline, tubes and ovaries appeared normal. The uterine incision was closed with running locked sutures of 0 Vicryl and a second layer of the same stitch was used in an imbricating fashion.  Excellent hemostasis was  obtained.  The pericolic gutters were then cleared of all clots and debris.  Interceed was placed over the hysterotomy. Alexis retractor was removed.  The fascia was then reapproximated with running sutures of 0 Vicryl. The subcutaneous tissue was reapproximated with 2-0 plain gut suture.   The skin was closed with 4-0 vicryl in a subcuticular fashion.  Instrument, sponge, and needle counts were correct prior the abdominal closure and at the conclusion of the case. The patient was taken to recovery in stable condition.

## 2017-04-14 NOTE — MAU Note (Signed)
Pt having ctx. Had herpes outbreak on Monday. Does not want to discuss in front of guests.

## 2017-04-14 NOTE — Discharge Instructions (Signed)

## 2017-04-14 NOTE — Transfer of Care (Signed)
Immediate Anesthesia Transfer of Care Note  Patient: Susan Cross  Procedure(s) Performed: Procedure(s): CESAREAN SECTION (N/A)  Patient Location: PACU  Anesthesia Type:Spinal  Level of Consciousness: awake, alert , oriented and patient cooperative  Airway & Oxygen Therapy: Patient Spontanous Breathing  Post-op Assessment: Report given to RN and Post -op Vital signs reviewed and stable  Post vital signs: Reviewed and stable  Last Vitals:  Vitals:   04/14/17 1711 04/14/17 1712  BP:  (!) 134/93  Pulse:  (!) 105  Resp: 20   Temp: 36.5 C     Last Pain:  Vitals:   04/14/17 1711  TempSrc: Oral  PainSc:          Complications: No apparent anesthesia complications

## 2017-04-14 NOTE — Addendum Note (Signed)
Addendum  created 04/14/17 2217 by Jairo BenJackson, Ruperto Kiernan, MD   Order list changed, Order sets accessed

## 2017-04-14 NOTE — Addendum Note (Signed)
Addendum  created 04/14/17 2201 by Jairo BenJackson, Emad Brechtel, MD   Order list changed, Order sets accessed

## 2017-04-14 NOTE — Anesthesia Procedure Notes (Signed)
Spinal  Patient location during procedure: OR End time: 04/14/2017 6:25 PM Staffing Anesthesiologist: Jairo BenJACKSON, Candance Bohlman Performed: anesthesiologist  Preanesthetic Checklist Completed: patient identified, surgical consent, pre-op evaluation, timeout performed, IV checked, risks and benefits discussed and monitors and equipment checked Spinal Block Patient position: sitting Prep: site prepped and draped and DuraPrep Patient monitoring: blood pressure, continuous pulse ox, cardiac monitor and heart rate Approach: midline Location: L3-4 Injection technique: single-shot Needle Needle type: Pencan  Needle gauge: 24 G Needle length: 9 cm Additional Notes Pt identified in Operating room.  Monitors applied. Working IV access confirmed. Sterile prep, drape lumbar spine.  1% lido local L 3,4.  #24ga Pencan into clear CSF L 3,4.  9mg  0.75% Bupivacaine with dextrose, fentanyl, morphine injected with asp CSF beginning and end of injection.  Patient asymptomatic, VSS, no heme aspirated, tolerated well.  Sandford Craze Kennith Morss, MD

## 2017-04-14 NOTE — H&P (Signed)
HPI: 26 y/o G2P0010 @ 6975w4d estimated gestational age (as dated by LMP c/w 20 week ultrasound) presents complaining of painful contractions that have gotten progressively worse throughout the day no Leaking of Fluid,   no Vaginal Bleeding,   + Uterine Contractions,  + Fetal Movement.  ROS: no HA, no epigastric pain, no visual changes.    Pregnancy complicated by: 1) HSV2- pt has active lesion noted on Monday.  Monday she reported itching and lesions noted, itching has now resolved. Currently on treatment 2) Jehovah's witness- pt does want blood transfusion as a life saving measure   Prenatal Transfer Tool  Maternal Diabetes: No Genetic Screening: Normal Maternal Ultrasounds/Referrals: Normal Fetal Ultrasounds or other Referrals:  None Maternal Substance Abuse:  No Significant Maternal Medications:  None Significant Maternal Lab Results: Lab values include: Group B Strep positive   PNL:  GBS positive, Rub Immune, Hep B neg, RPR NR, HIV neg, GC/C neg, glucola:117 Hgb: 10.3 Blood type: O positive, antibody neg  Immunizations: Tdap: 02/08/17  OBHx: SABx1 PMHx:  HSV2 Meds:  PNV, valtrex Allergy:  No Known Allergies SurgHx: none SocHx:   no Tobacco, no  EtOH, no Illicit Drugs  O: BP (!) 134/93   Pulse (!) 105   Temp 97.7 F (36.5 C) (Oral)   Resp 20   LMP 07/04/2016 Comment: only bled 2 days  SpO2 100%  Gen. AAOx3, NAD CV.  RRR  No murmur.  Resp. CTAB, no wheeze or crackles. Abd. Gravid,  no tenderness,  no rigidity,  no guarding Extr.  no edema B/L , no calf tenderness  FHT: 145 baseline, moderate variability, + accels,  no decels Toco: q 4 min SVE: per RN: 4-5/80/0  SSE:  Small lesion and erythema noted on left labia minora, no other lesions appreciated  Labs:  CBC    Component Value Date/Time   WBC 8.8 04/14/2017 1733   RBC 4.41 04/14/2017 1733   HGB 10.7 (L) 04/14/2017 1733   HCT 33.0 (L) 04/14/2017 1733   PLT 304 04/14/2017 1733   MCV 74.8 (L) 04/14/2017  1733   MCH 24.3 (L) 04/14/2017 1733   MCHC 32.4 04/14/2017 1733   RDW 14.1 04/14/2017 1733   LYMPHSABS 1.9 10/15/2016 2320   MONOABS 0.6 10/15/2016 2320   EOSABS 0.1 10/15/2016 2320   BASOSABS 0.0 10/15/2016 2320    A/P:  26 y.o. G2P0010 @ 3175w4d EGA who presents for active labor with active HSV2 -FWB:  NICHD Cat I FHTs -Due to HSV plan for primary C-section. Risk benefits and alternatives of cesarean section were discussed with the patient including but not limited to infection, bleeding, damage to bowel , bladder and baby with the need for further surgery. Pt voiced understanding and desires to proceed.   Myna HidalgoJennifer Brenden Rudman, DO [phone]) [phone] (office)

## 2017-04-14 NOTE — Anesthesia Postprocedure Evaluation (Signed)
Anesthesia Post Note  Patient: Susan Cross  Procedure(s) Performed: Procedure(s) (LRB): CESAREAN SECTION (N/A)     Patient location during evaluation: PACU Anesthesia Type: Spinal Level of consciousness: awake and alert, patient cooperative and oriented Pain management: pain level controlled Vital Signs Assessment: post-procedure vital signs reviewed and stable Respiratory status: spontaneous breathing, nonlabored ventilation and respiratory function stable Cardiovascular status: blood pressure returned to baseline and stable Postop Assessment: patient able to bend at knees and spinal receding Anesthetic complications: no    Last Vitals:  Vitals:   04/14/17 1935 04/14/17 1945  BP: 109/76 120/74  Pulse: 72 72  Resp: 17 17  Temp: (!) 36.4 C     Last Pain:  Vitals:   04/14/17 1945  TempSrc:   PainSc: 0-No pain   Pain Goal:                 Jasmeen Fritsch,E. Issac Moure

## 2017-04-14 NOTE — MAU Note (Signed)
History     CSN: [id number]  Arrival date & time 04/14/17  0214   None     Chief Complaint  Patient presents with  . Labor Eval    Pt states she has been having contractions all day that are now 8 minutes apart. States she had an active herpes outbreak on Monday (3 days ago) and was seen in the office. States it is now scabbed over but still there. She is worried about having to have a C/S. She states that she was hoping that she could go over 41 weeks and hopefully lesion would be gone.    Past Medical History:  Diagnosis Date  . Anemia   . Genital herpes   . Genital herpes    last was early in preg  . Infection    UTI  . Medical history non-contributory     Past Surgical History:  Procedure Laterality Date  . NO PAST SURGERIES      Family History  Problem Relation Age of Onset  . Heart disease Father   . Cancer Maternal Aunt   . Cancer Maternal Grandmother   . Heart disease Paternal Grandmother     Social History  Substance Use Topics  . Smoking status: Never Smoker  . Smokeless tobacco: Never Used  . Alcohol use Yes     Comment: occiasionally not since pregnancy    OB History    Gravida Para Term Preterm AB Living   2       1     SAB TAB Ectopic Multiple Live Births   1              Review of Systems  Gastrointestinal: Positive for abdominal pain.  All other systems reviewed and are negative.   Allergies  Patient has no known allergies.  Home Medications    BP 115/68 (BP Location: Right Arm)   Pulse 73   Temp 98.3 F (36.8 C) (Oral)   Resp (!) 21   Ht 5' (1.524 m)   Wt 184 lb (83.5 kg)   LMP 07/04/2016 Comment: only bled 2 days  BMI 35.94 kg/m   Physical Exam  Constitutional: She is oriented to person, place, and time. She appears well-developed and well-nourished.  HENT:  Head: Normocephalic and atraumatic.  Cardiovascular: Normal rate and regular rhythm.   Pulmonary/Chest: Effort normal and breath sounds normal. No respiratory  distress.  Abdominal: Soft. Bowel sounds are normal.  Genitourinary: Vagina normal.  Musculoskeletal: Normal range of motion.  Neurological: She is alert and oriented to person, place, and time.  Skin: Skin is warm and dry.  Psychiatric: She has a normal mood and affect. Her behavior is normal.  Nursing note and vitals reviewed.   MAU Course  Procedures (including critical care time)  Labs Reviewed - No data to display No results found.   No diagnosis found.    MDM  EFm, IV hydration and sedation. Will recheck at 630am for cervical change. No cervical change.  Discussed D/C with Dr. Charlotta Newtonzan and pt will be discharged to home. Pt is to come back to MAU if labor progresses and will have C/S due to HSV outbreak

## 2017-04-14 NOTE — Anesthesia Preprocedure Evaluation (Signed)
Anesthesia Evaluation  Patient identified by MRN, date of birth, ID band Patient awake    Reviewed: Allergy & Precautions, H&P , Patient's Chart, lab work & pertinent test results  Airway Mallampati: II  TM Distance: >3 FB Neck ROM: full    Dental no notable dental hx.    Pulmonary    Pulmonary exam normal breath sounds clear to auscultation       Cardiovascular Exercise Tolerance: Good  Rhythm:regular Rate:Normal     Neuro/Psych    GI/Hepatic   Endo/Other    Renal/GU      Musculoskeletal   Abdominal   Peds  Hematology   Anesthesia Other Findings   Reproductive/Obstetrics                             Anesthesia Physical Anesthesia Plan  ASA: II  Anesthesia Plan: Spinal   Post-op Pain Management:    Induction:   PONV Risk Score and Plan:   Airway Management Planned:   Additional Equipment:   Intra-op Plan:   Post-operative Plan:   Informed Consent: I have reviewed the patients History and Physical, chart, labs and discussed the procedure including the risks, benefits and alternatives for the proposed anesthesia with the patient or authorized representative who has indicated his/her understanding and acceptance.     Plan Discussed with:   Anesthesia Plan Comments: (  )        Anesthesia Quick Evaluation  

## 2017-04-15 ENCOUNTER — Encounter (HOSPITAL_COMMUNITY): Payer: Self-pay | Admitting: Obstetrics & Gynecology

## 2017-04-15 LAB — CBC
HCT: 28.6 % — ABNORMAL LOW (ref 36.0–46.0)
Hemoglobin: 9.4 g/dL — ABNORMAL LOW (ref 12.0–15.0)
MCH: 24.8 pg — AB (ref 26.0–34.0)
MCHC: 32.9 g/dL (ref 30.0–36.0)
MCV: 75.5 fL — ABNORMAL LOW (ref 78.0–100.0)
PLATELETS: 251 10*3/uL (ref 150–400)
RBC: 3.79 MIL/uL — AB (ref 3.87–5.11)
RDW: 14.3 % (ref 11.5–15.5)
WBC: 9.5 10*3/uL (ref 4.0–10.5)

## 2017-04-15 LAB — RPR: RPR: NONREACTIVE

## 2017-04-15 MED ORDER — FERROUS SULFATE 325 (65 FE) MG PO TABS
325.0000 mg | ORAL_TABLET | Freq: Every day | ORAL | Status: DC
  Administered 2017-04-15 – 2017-04-17 (×3): 325 mg via ORAL
  Filled 2017-04-15 (×3): qty 1

## 2017-04-15 MED ORDER — NALBUPHINE HCL 10 MG/ML IJ SOLN
5.0000 mg | INTRAMUSCULAR | Status: DC | PRN
  Administered 2017-04-15: 5 mg via INTRAVENOUS
  Filled 2017-04-15 (×4): qty 1

## 2017-04-15 NOTE — Plan of Care (Signed)
Problem: Pain Managment: Goal: General experience of comfort will improve Outcome: Progressing Communicated with Dr. Laural BenesJohnson for PRN medicines for itching and pain. 2227 dose of benedryl given with permission although previous dose had been given at 2047.

## 2017-04-15 NOTE — Progress Notes (Signed)
Postoperative Note Day # 1  S:  Patient resting comfortable in bed.  Pain controlled.  Tolerating general diet. No flatus, no BM.  Lochia moderate.  Ambulating without difficulty. Foley in place. She denies n/v/f/c, SOB, or CP.  Pt plans on breastfeeding.  O: Temp:  [97.5 F (36.4 C)-98.6 F (37 C)] 97.6 F (36.4 C) (08/04 0410) Pulse Rate:  [63-105] 63 (08/04 0410) Resp:  [12-21] 12 (08/04 0410) BP: (103-134)/(71-93) 103/71 (08/04 0410) SpO2:  [98 %-100 %] 99 % (08/03 2215)   Gen: A&Ox3, NAD CV: RRR Resp: CTAB Abdomen: soft, NT, ND, +BS Uterus: firm, non-tender, below umbilicus Incision: c/d/i, bandage on Ext: No edema, no calf tenderness bilaterally, SCDs in place  Labs:  CBC Latest Ref Rng & Units 04/15/2017 04/14/2017 03/23/2017  WBC 4.0 - 10.5 K/uL 9.5 8.8 8.6  Hemoglobin 12.0 - 15.0 g/dL 4.0(J9.4(L) 10.7(L) 10.6(L)  Hematocrit 36.0 - 46.0 % 28.6(L) 33.0(L) 32.0(L)  Platelets 150 - 400 K/uL 251 304 297    A/P: Pt is a 26 y.o. G1P1001 s/p Primary C-section, POD#1  - Pain well controlled -GU: UOP is adequate, plan to remove foley this am -GI: Tolerating general diet -Activity: encouraged sitting up to chair and ambulation as tolerated -Prophylaxis: SCDs while in bed, early ambulation -Labs: stable as above, iron daily  DISPO: Continue routine postoperative care.  Myna HidalgoJennifer Cyra Spader, DO [phone] (pager) [phone] (office)

## 2017-04-15 NOTE — Lactation Note (Signed)
This note was copied from a baby's chart. Lactation Consultation Note New mom states BF going well. Baby has stuffy nose, makes snorting sounds at intervals while BF. Discussed positions for BF. Mom hasn't tried football position. Assisted and latched baby w/o difficulty. Hand expressed colostrum.  Mom has "V" shaped breast w/good everted nipples.  Mom encouraged to feed baby 8-12 times/24 hours and with feeding cues. Mom encouraged to waken baby for feeds.  Educated on newborn behavior and habits, I&O, STS, supply and demand. Encouraged to call for assistance or questions. Mom has WIC. Gave a list of outside recourses. Patient Name: Susan Cross SectionRaina Otani ZOXWR'UToday's Date: 04/15/2017 Reason for consult: Initial assessment;1st time breastfeeding   Maternal Data Has patient been taught Hand Expression?: Yes Does the patient have breastfeeding experience prior to this delivery?: No  Feeding Feeding Type: Breast Fed Length of feed: 5 min (still BF)  LATCH Score Latch: Repeated attempts needed to sustain latch, nipple held in mouth throughout feeding, stimulation needed to elicit sucking reflex.  Audible Swallowing: A few with stimulation  Type of Nipple: Everted at rest and after stimulation  Comfort (Breast/Nipple): Soft / non-tender  Hold (Positioning): Assistance needed to correctly position infant at breast and maintain latch.  LATCH Score: 7  Interventions Interventions: Breast feeding basics reviewed;Support pillows;Assisted with latch;Position options;Skin to skin;Expressed milk;Breast massage;Hand express;Breast compression;Adjust position  Lactation Tools Discussed/Used WIC Program: Yes   Consult Status Consult Status: Follow-up Date: 04/16/17 Follow-up type: In-patient    Jarious Lyon, Diamond NickelLAURA G 04/15/2017, 1:24 AM

## 2017-04-16 NOTE — Progress Notes (Signed)
Postoperative Note Day # 2  S:  Patient resting comfortable in bed.  Pain controlled.  Tolerating general diet. + flatus, no BM.  Lochia moderate.  Ambulating without difficulty and voiding freely. She denies n/v/f/c, SOB, or CP.  Pt plans on breastfeeding.  O: Temp:  [97.6 F (36.4 C)-98.3 F (36.8 C)] 98.3 F (36.8 C) (08/05 0400) Pulse Rate:  [54-86] 75 (08/05 0400) Resp:  [18] 18 (08/05 0400) BP: (107-123)/(58-77) 109/69 (08/05 0400) SpO2:  [98 %-100 %] 98 % (08/04 1922)   Gen: A&Ox3, NAD CV: RRR Resp: CTAB Abdomen: soft, NT, ND, +BS Uterus: firm, non-tender, below umbilicus Incision: c/d/i, bandage on Ext: No edema, no calf tenderness bilaterally, SCDs in place  Labs:  CBC Latest Ref Rng & Units 04/15/2017 04/14/2017 03/23/2017  WBC 4.0 - 10.5 K/uL 9.5 8.8 8.6  Hemoglobin 12.0 - 15.0 g/dL 1.6(X9.4(L) 10.7(L) 10.6(L)  Hematocrit 36.0 - 46.0 % 28.6(L) 33.0(L) 32.0(L)  Platelets 150 - 400 K/uL 251 304 297    A/P: Pt is a 26 y.o. G1P1001 s/p Primary C-section, POD#2  - Pain well controlled -GU: Voiding freely -GI: Tolerating general diet -Activity: encouraged sitting up to chair and ambulation as tolerated -Prophylaxis: SCDs while in bed, early ambulation -Labs: stable as above, iron daily  DISPO: Continue routine postoperative care.  Myna HidalgoJennifer Hazyl Marseille, DO [phone] (pager) [phone] (office)

## 2017-04-16 NOTE — Lactation Note (Signed)
This note was copied from a baby's chart. Lactation Consultation Note Mom stated baby is wanting to feed all the time. Educated on newborn feeding habits, behavior, and cluster feeding. Mom BF in football position. Baby had good latch. Mom stated baby prefers Lt. Breast. Getting slightly sore, skin intact. [facility] taught how to [place]. Hand expressed easy flow of colostrum. Encouraged to breast massage at intervals during BF.  Mom has everted nipples for easy latching. Praised mom for good BF.  Patient Name: Girl Doristine SectionRaina Seybold ZOXWR'UToday's Date: 04/16/2017 Reason for consult: Follow-up assessment   Maternal Data    Feeding Feeding Type: Breast Fed Length of feed: 30 min  LATCH Score Latch: Grasps breast easily, tongue down, lips flanged, rhythmical sucking.  Audible Swallowing: A few with stimulation  Type of Nipple: Everted at rest and after stimulation  Comfort (Breast/Nipple): Filling, red/small blisters or bruises, mild/mod discomfort  Hold (Positioning): No assistance needed to correctly position infant at breast.  LATCH Score: 8  Interventions Interventions: Breast feeding basics reviewed;Support pillows;Position options;Skin to skin;Expressed milk;Breast massage;Hand express;Breast compression  Lactation Tools Discussed/Used     Consult Status Consult Status: Follow-up Date: 04/17/17 Follow-up type: In-patient    Jiyaan Steinhauser, Diamond NickelLAURA G 04/16/2017, 5:30 AM

## 2017-04-17 DIAGNOSIS — O98519 Other viral diseases complicating pregnancy, unspecified trimester: Secondary | ICD-10-CM

## 2017-04-17 DIAGNOSIS — B009 Herpesviral infection, unspecified: Secondary | ICD-10-CM | POA: Diagnosis present

## 2017-04-17 MED ORDER — IBUPROFEN 600 MG PO TABS: 600.0000 mg | ORAL_TABLET | Freq: Four times a day (QID) | ORAL | 1 refills | Status: DC | PRN

## 2017-04-17 MED ORDER — FERROUS SULFATE 325 (65 FE) MG PO TABS: 325.0000 mg | ORAL_TABLET | Freq: Every day | ORAL | 1 refills | Status: DC

## 2017-04-17 MED ORDER — OXYCODONE HCL 5 MG PO TABS: 5.0000 mg | ORAL_TABLET | ORAL | 0 refills | Status: DC | PRN

## 2017-04-17 NOTE — Lactation Note (Signed)
This note was copied from a baby's chart. Lactation Consultation Note: assist mother to chair for better support while breastfeeding . Mother taught cross cradle hold. Infant latched well with good depth. Mother denies having any nipple tenderness while breastfeeding. Infant sustained latch for 15 mins. Observed that infant was still feeding when I left the room. Mother has a battery powered hand pump. She was advised to cue base feed infant and at least 8-12 times in 24 hours. Encouraged skin to skin. Mother advised in treatment to prevent severe engorgement. Mother receptive to all teaching. Mother is aware of available LC services and community support.   Patient Name: Susan Cross SectionRaina Desroches ZOXWR'UToday's Date: 04/17/2017 Reason for consult: Follow-up assessment   Maternal Data    Feeding Feeding Type: Breast Fed Length of feed: 20 min  LATCH Score Latch: Grasps breast easily, tongue down, lips flanged, rhythmical sucking.  Audible Swallowing: Spontaneous and intermittent  Type of Nipple: Everted at rest and after stimulation  Comfort (Breast/Nipple): Filling, red/small blisters or bruises, mild/mod discomfort  Hold (Positioning): Assistance needed to correctly position infant at breast and maintain latch.  LATCH Score: 8  Interventions Interventions: Assisted with latch;Skin to skin;Breast compression;Adjust position;Support pillows  Lactation Tools Discussed/Used     Consult Status Consult Status: Complete    Michel BickersKendrick, Ashlin Kreps McCoy 04/17/2017, 10:48 AM

## 2017-04-17 NOTE — Discharge Summary (Signed)
OB Discharge Summary     Patient Name: Susan Cross DOB: 15-Mar-1991 MRN: [id number]  Date of admission: 04/14/2017 Delivering MD: Myna Hidalgo   Date of discharge: 04/17/2017  Admitting diagnosis: 41wks Labor  Intrauterine pregnancy: [redacted]w[redacted]d     Secondary diagnosis:  Active Problems:   Normal intrauterine pregnancy in third trimester   HSV-2 infection complicating pregnancy  Additional problems: None     Discharge diagnosis: Term Pregnancy Delivered   2) HSV infection                                                                                           Post partum procedures:None  Augmentation: NA  Complications: None  Hospital course:  Onset of Labor With Unplanned C/S  26 y.o. yo G2P1011 at [redacted]w[redacted]d was admitted in Active Labor on 04/14/2017. Patient had a labor course significant for ACTIVE HSV infection.... Membrane Rupture Time/Date: 6:44 PM ,04/14/2017   The patient went for cesarean section due to Active HSV, and delivered a Viable infant,04/14/2017  Details of operation can be found in separate operative note. Patient had an uncomplicated postpartum course.  She is ambulating,tolerating a regular diet, passing flatus, and urinating well.  Patient is discharged home in stable condition 04/17/17.  Physical exam  Vitals:   04/16/17 0400 04/16/17 0930 04/16/17 1842 04/17/17 0545  BP: 109/69  135/75 120/78  Pulse: 75  (!) 107 76  Resp: 18  18 20   Temp: 98.3 F (36.8 C)  98.7 F (37.1 C) 98.1 F (36.7 C)  TempSrc: Oral  Oral Oral  SpO2:    100%  Weight:  82.6 kg (182 lb)    Height:  5' 1.5" (1.562 m)     General: alert, cooperative and no distress Lochia: appropriate Uterine Fundus: firm Incision: Healing well with no significant drainage DVT Evaluation: No evidence of DVT seen on physical exam. Labs: Lab Results  Component Value Date   WBC 9.5 04/15/2017   HGB 9.4 (L) 04/15/2017   HCT 28.6 (L) 04/15/2017   MCV 75.5 (L) 04/15/2017   PLT 251 04/15/2017   CMP  Latest Ref Rng & Units 04/14/2017  Glucose 65 - 99 mg/dL 78  BUN 6 - 20 mg/dL 9  Creatinine 1.47 - 8.29 mg/dL 5.62  Sodium 130 - 865 mmol/L 135  Potassium 3.5 - 5.1 mmol/L 3.8  Chloride 101 - 111 mmol/L 107  CO2 22 - 32 mmol/L 19(L)  Calcium 8.9 - 10.3 mg/dL 8.9  Total Protein 6.5 - 8.1 g/dL 6.6  Total Bilirubin 0.3 - 1.2 mg/dL 0.7  Alkaline Phos 38 - 126 U/L 136(H)  AST 15 - 41 U/L 22  ALT 14 - 54 U/L 13(L)    Discharge instruction: per After Visit Summary and "Baby and Me Booklet".  After visit meds:  Allergies as of 04/17/2017   No Known Allergies     Medication List    TAKE these medications   [employer] PRENATAL GUMMY PO Take 2 capsules by mouth daily.   ferrous sulfate 325 (65 FE) MG tablet Take 1 tablet (325 mg total) by mouth daily with breakfast.   ibuprofen 600  MG tablet Commonly known as:  ADVIL,MOTRIN Take 1 tablet (600 mg total) by mouth every 6 (six) hours as needed.   ondansetron 4 MG disintegrating tablet Commonly known as:  ZOFRAN ODT Take 1 tablet (4 mg total) by mouth every 8 (eight) hours as needed for nausea or vomiting.   oxyCODONE 5 MG immediate release tablet Commonly known as:  Oxy IR/ROXICODONE Take 1 tablet (5 mg total) by mouth every 4 (four) hours as needed (pain scale 4-7).   valACYclovir 500 MG tablet Commonly known as:  VALTREX Take 1,000 mg by mouth daily. Pt took 2 gm for one week before delivery       Diet: routine diet  Activity: Advance as tolerated. Pelvic rest for 6 weeks.   Outpatient follow up:2 weeks Follow up Appt:No future appointments. Follow up Visit:No Follow-up on file.  Postpartum contraception: Not Discussed  Newborn Data: Live born female  Birth Weight: 6 lb 13.2 oz (3095 g) APGAR: 9, 9  Baby Feeding: Breast Disposition:home with mother   04/17/2017 Jessee AversOLE,Girard Koontz J., MD

## 2017-04-19 ENCOUNTER — Inpatient Hospital Stay (HOSPITAL_COMMUNITY): Admission: RE | Admit: 2017-04-19 | Payer: Federal, State, Local not specified - PPO | Source: Ambulatory Visit

## 2017-05-10 DIAGNOSIS — F99 Mental disorder, not otherwise specified: Secondary | ICD-10-CM | POA: Diagnosis not present

## 2017-05-16 DIAGNOSIS — F99 Mental disorder, not otherwise specified: Secondary | ICD-10-CM | POA: Diagnosis not present

## 2017-05-18 DIAGNOSIS — N898 Other specified noninflammatory disorders of vagina: Secondary | ICD-10-CM | POA: Diagnosis not present

## 2017-06-06 DIAGNOSIS — Z98891 History of uterine scar from previous surgery: Secondary | ICD-10-CM | POA: Diagnosis not present

## 2017-06-27 ENCOUNTER — Ambulatory Visit (INDEPENDENT_AMBULATORY_CARE_PROVIDER_SITE_OTHER): Payer: Federal, State, Local not specified - PPO

## 2017-06-27 ENCOUNTER — Ambulatory Visit (HOSPITAL_COMMUNITY)
Admission: EM | Admit: 2017-06-27 | Discharge: 2017-06-27 | Disposition: A | Discharge status: Home / Self Care | Payer: Federal, State, Local not specified - PPO | Attending: Family Medicine | Admitting: Family Medicine

## 2017-06-27 ENCOUNTER — Encounter (HOSPITAL_COMMUNITY): Payer: Self-pay | Admitting: Emergency Medicine

## 2017-06-27 DIAGNOSIS — M542 Cervicalgia: Secondary | ICD-10-CM | POA: Diagnosis not present

## 2017-06-27 DIAGNOSIS — S161XXA Strain of muscle, fascia and tendon at neck level, initial encounter: Secondary | ICD-10-CM
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
## 2017-06-27 NOTE — Discharge Instructions (Signed)
You may use over the counter ibuprofen or acetaminophen as needed.  ° °
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
## 2017-09-06 ENCOUNTER — Encounter (HOSPITAL_COMMUNITY): Payer: Self-pay | Admitting: Emergency Medicine

## 2017-09-06 ENCOUNTER — Ambulatory Visit (HOSPITAL_COMMUNITY)
Admission: EM | Admit: 2017-09-06 | Discharge: 2017-09-06 | Disposition: A | Discharge status: Home / Self Care | Payer: Federal, State, Local not specified - PPO | Attending: Family Medicine | Admitting: Family Medicine

## 2017-09-06 DIAGNOSIS — J069 Acute upper respiratory infection, unspecified: Secondary | ICD-10-CM | POA: Diagnosis not present

## 2017-09-06 MED ORDER — AMOXICILLIN-POT CLAVULANATE 875-125 MG PO TABS: 1.0000 | ORAL_TABLET | Freq: Two times a day (BID) | ORAL | 0 refills | Status: AC

## 2017-09-06 MED ORDER — ONDANSETRON 4 MG PO TBDP: 4.0000 mg | ORAL_TABLET | Freq: Three times a day (TID) | ORAL | 0 refills | Status: DC | PRN

## 2017-09-06 NOTE — ED Provider Notes (Signed)
[facility]    CSN: [id number] Arrival date & time: 09/06/17  1630     History   Chief Complaint Chief Complaint  Patient presents with  . URI    HPI Susan Cross is a 26 y.o. female presenting with URI symptoms for 7 days. Symptoms are mainly sore throat, congestion and runny nose. Taking Tylenol. Has developed intermittent nausea and vomiting. Able to still tolerate PO food and liquid. Denies cough, shortness of breath, chest pain, abdominal pain, ear pain.   HPI  Past Medical History:  Diagnosis Date  . Anemia   . Genital herpes   . Genital herpes    last was early in preg  . Infection    UTI    Patient Active Problem List   Diagnosis Date Noted  . 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 Normal intrauterine pregnancy in third trimester 04/14/2017    Past Surgical History:  Procedure Laterality Date  . CESAREAN SECTION N/A 04/14/2017   Procedure: CESAREAN SECTION;  Surgeon: Myna Hidalgo, DO;  Location: [facility];  Service: Obstetrics;  Laterality: N/A;  . NO PAST SURGERIES      OB History    Gravida Para Term Preterm AB Living   2 1 1   1 1    SAB TAB Ectopic Multiple Live Births   1     0 1       Home Medications    Prior to Admission medications   Medication Sig Start Date End Date Taking? Authorizing Provider  valACYclovir (VALTREX) 500 MG tablet Take 1,000 mg by mouth daily. Pt took 2 gm for one week before delivery   Yes [provider]  amoxicillin-clavulanate (AUGMENTIN) 875-125 MG tablet Take 1 tablet by mouth every 12 (twelve) hours for 5 days. 09/06/17 09/11/17  Alazar Cherian C, PA-C  ferrous sulfate 325 (65 FE) MG tablet Take 1 tablet (325 mg total) by mouth daily with breakfast. 04/17/17   Gerald Leitz, MD  ibuprofen (ADVIL,MOTRIN) 600 MG tablet Take 1 tablet (600 mg total) by mouth every 6 (six) hours as needed. 04/17/17   Gerald Leitz, MD  ondansetron (ZOFRAN ODT) 4 MG disintegrating tablet Take 1 tablet  (4 mg total) by mouth every 8 (eight) hours as needed for up to 5 days for nausea or vomiting. 09/06/17 09/11/17  English Craighead C, PA-C  oxyCODONE (OXY IR/ROXICODONE) 5 MG immediate release tablet Take 1 tablet (5 mg total) by mouth every 4 (four) hours as needed (pain scale 4-7). 04/17/17   Gerald Leitz, MD  Prenatal Vit-Min-FA-Fish Oil ([employer] PRENATAL GUMMY PO) Take 2 capsules by mouth daily.     [provider]    Family History Family History  Problem Relation Age of Onset  . Heart disease Father   . Cancer Maternal Aunt   . Cancer Maternal Grandmother   . Heart disease Paternal Grandmother     Social History Social History   Tobacco Use  . Smoking status: Never Smoker  . Smokeless tobacco: Never Used  Substance Use Topics  . Alcohol use: Yes    Comment: occiasionally not since pregnancy  . Drug use: No     Allergies   Patient has no known allergies.   Review of Systems Review of Systems  Constitutional: Negative for chills and fever.  HENT: Positive for congestion, rhinorrhea and sore throat. Negative for ear pain.   Eyes: Negative for pain and visual disturbance.  Respiratory: Negative for cough and shortness of breath.  Cardiovascular: Negative for chest pain and palpitations.  Gastrointestinal: Positive for nausea and vomiting. Negative for abdominal pain.  Musculoskeletal: Negative for back pain and myalgias.  Skin: Negative for color change and rash.  Neurological: Negative for dizziness, light-headedness and headaches.  All other systems reviewed and are negative.    Physical Exam Triage Vital Signs ED Triage Vitals [09/06/17 1717]  Enc Vitals Group     BP 110/76     Pulse Rate 71     Resp 16     Temp 98.4 F (36.9 C)     Temp Source Oral     SpO2 100 %     Weight      Height      Head Circumference      Peak Flow      Pain Score      Pain Loc      Pain Edu?      Excl. in GC?    No data found.  Updated Vital Signs BP 110/76 (BP  Location: Left Arm)   Pulse 71   Temp 98.4 F (36.9 C) (Oral)   Resp 16   LMP 08/30/2017   SpO2 100%   Breastfeeding? No   Visual Acuity Right Eye Distance:   Left Eye Distance:   Bilateral Distance:    Right Eye Near:   Left Eye Near:    Bilateral Near:     Physical Exam  Constitutional: She is oriented to person, place, and time. She appears well-developed and well-nourished. No distress.  HENT:  Head: Normocephalic and atraumatic.  Right Ear: Tympanic membrane and ear canal normal.  Left Ear: Tympanic membrane and ear canal normal.  Mouth/Throat: Uvula is midline, oropharynx is clear and moist and mucous membranes are normal. No trismus in the jaw. No uvula swelling. No posterior oropharyngeal erythema. Tonsils are 2+ on the right. Tonsils are 2+ on the left. No tonsillar exudate.  Eyes: Conjunctivae are normal.  Neck: Neck supple.  Cardiovascular: Normal rate and regular rhythm.  No murmur heard. Pulmonary/Chest: Effort normal. No respiratory distress. She has wheezes.  Occasional expiratory wheeze on left lower lung  Abdominal: Soft. There is no tenderness.  Musculoskeletal: She exhibits no edema.  Neurological: She is alert and oriented to person, place, and time.  Skin: Skin is warm and dry.  Psychiatric: She has a normal mood and affect.  Nursing note and vitals reviewed.    UC Treatments / Results  Labs (all labs ordered are listed, but only abnormal results are displayed) Labs Reviewed - No data to display  EKG  EKG Interpretation None       Radiology No results found.  Procedures Procedures (including critical care time)  Medications Ordered in UC Medications - No data to display   Initial Impression / Assessment and Plan / UC Course  I have reviewed the triage vital signs and the nursing notes.  Pertinent labs & imaging results that were available during my care of the patient were reviewed by me and considered in my medical decision making  (see chart for details).     Patient presents with symptoms likely from a viral upper respiratory infection. Differential includes bacterial pneumonia, sinusitis, allergic rhinitis, acute bronchitis. Do not suspect underlying cardiopulmonary process. Symptoms seem unlikely related to ACS, CHF or COPD exacerbations, pneumonia, pneumothorax. Patient is nontoxic appearing and not in need of emergent medical intervention.  Recommended symptom control with over the counter medications: Daily oral anti-histamine, Oral decongestant or IN corticosteroid,  saline irrigations, cepacol lozenges, Robitussin, Delsym, honey tea. Zofran for nausea. May pick up Augmentin on Friday if not improving.   Return if symptoms fail to improve in 1-2 weeks or you develop shortness of breath, chest pain, severe headache. Patient states understanding and is agreeable. Discussed return precautions. Patient verbalized understanding and is agreeable with plan.     Final Clinical Impressions(s) / UC Diagnoses   Final diagnoses:  Upper respiratory tract infection, unspecified type    ED Discharge Orders        Ordered    ondansetron (ZOFRAN ODT) 4 MG disintegrating tablet  Every 8 hours PRN     09/06/17 1732    amoxicillin-clavulanate (AUGMENTIN) 875-125 MG tablet  Every 12 hours    Comments:  Please do not fill until 12/28   09/06/17 1732       Controlled Substance Prescriptions [place] Controlled Substance Registry consulted? Not Applicable   Lew DawesWieters, Ceniyah Thorp C, [place] JerseyPA-C 09/06/17 1750

## 2017-09-06 NOTE — Discharge Instructions (Signed)
You likely having a viral upper respiratory infection. We recommended symptom control. I expect your symptoms to start improving in the next 1-2 weeks. You may pick up Augmentin (Antibiotic) on Friday, if you are not improving with symptom control  1. Take a daily allergy pill/anti-histamine like Zyrtec, Claritin, or Store brand consistently for 2 weeks  2. For congestion you may try an oral decongestant like Mucinex or sudafed. You may also try intranasal flonase nasal spray or saline irrigations (neti pot, sinus cleanse)  3. For your sore throat you may try cepacol lozenges, salt water gargles, throat spray. Treatment of congestion may also help your sore throat.  4. For cough you may try Robitussen, Mucinex DM  5. Take Tylenol or Ibuprofen to help with pain/inflammation  6. Stay hydrated, drink plenty of fluids to keep throat coated and less irritated  Honey Tea For cough/sore throat try using a honey-based tea. Use 3 teaspoons of honey with juice squeezed from half lemon. Place shaved pieces of ginger into 1/2-1 cup of water and warm over stove top. Then mix the ingredients and repeat every 4 hours as needed.

## 2017-09-06 NOTE — ED Triage Notes (Signed)
PT C/O: cold sx associated w/nasal drainage, watery eyes, sore throat, n/v  ONSET: 6 days  DENIES: fevers  TAKING MEDS: Acetaminophen   A&O x4... NAD... Ambulatory

## 2017-09-08 ENCOUNTER — Emergency Department (HOSPITAL_COMMUNITY)
Admission: EM | Admit: 2017-09-08 | Discharge: 2017-09-08 | Disposition: A | Discharge status: Home / Self Care | Payer: 59 | Source: Home / Self Care

## 2017-09-08 ENCOUNTER — Other Ambulatory Visit: Payer: Self-pay

## 2017-09-08 ENCOUNTER — Encounter (HOSPITAL_BASED_OUTPATIENT_CLINIC_OR_DEPARTMENT_OTHER): Payer: Self-pay | Admitting: *Deleted

## 2017-09-08 ENCOUNTER — Encounter (HOSPITAL_COMMUNITY): Payer: Self-pay | Admitting: Emergency Medicine

## 2017-09-08 DIAGNOSIS — Z5321 Procedure and treatment not carried out due to patient leaving prior to being seen by health care provider: Secondary | ICD-10-CM

## 2017-09-08 DIAGNOSIS — B349 Viral infection, unspecified: Secondary | ICD-10-CM | POA: Insufficient documentation

## 2017-09-08 DIAGNOSIS — Z79899 Other long term (current) drug therapy: Secondary | ICD-10-CM | POA: Insufficient documentation

## 2017-09-08 DIAGNOSIS — R05 Cough: Secondary | ICD-10-CM

## 2017-09-08 NOTE — ED Triage Notes (Signed)
Patient presents with multiple complaints : Gen. body aches , chills , emesis/diarrhea , sore throat , nasal congestion/rhinorrhea , dry cough and fatigue onset this week .

## 2017-09-08 NOTE — ED Triage Notes (Signed)
[facility] due to long [facility]. Here with chills, body aches, lethargy, vomiting, diarrhea, sore throat, and nasal congestion.

## 2017-09-09 ENCOUNTER — Emergency Department (HOSPITAL_BASED_OUTPATIENT_CLINIC_OR_DEPARTMENT_OTHER)
Admission: EM | Admit: 2017-09-09 | Discharge: 2017-09-09 | Disposition: A | Discharge status: Home / Self Care | Payer: 59 | Attending: Emergency Medicine | Admitting: Emergency Medicine

## 2017-09-09 DIAGNOSIS — B349 Viral infection, unspecified: Secondary | ICD-10-CM

## 2017-09-09 MED ORDER — ONDANSETRON 8 MG PO TBDP: ORAL_TABLET | ORAL | 0 refills | Status: DC

## 2017-09-09 NOTE — Discharge Instructions (Signed)
Zofran as prescribed as needed for nausea.  Over-the-counter medications as needed for relief of congestion and cough.  Tylenol 1000 mg rotated with ibuprofen 600 mg every 4 hours as needed for pain or fever.  Follow-up with your primary doctor if not improving in the next week, and return to the ER if you develop chest pain, difficulty breathing, bloody stools, or other new and concerning symptoms.

## 2017-09-09 NOTE — ED Provider Notes (Signed)
[facility] EMERGENCY DEPARTMENT Provider Note   CSN: [id number] Arrival date & time: 09/08/17  2317     History   Chief Complaint Chief Complaint  Patient presents with  . Influenza    HPI Doristine SectionRaina Vasek is a 26 y.o. female.  Patient is a 26 year old female presenting with complaints of nasal congestion, cough, vomiting, diarrhea, body aches that started approximately 24 hours ago.  She denies any new contacts or exposures.  She denies any bloody stool or vomit.  She denies any significant abdominal discomfort.   The history is provided by the patient.  Influenza  Presenting symptoms: cough, diarrhea, fatigue, nausea and vomiting   Presenting symptoms: no myalgias and no shortness of breath     Past Medical History:  Diagnosis Date  . Anemia   . Genital herpes   . Genital herpes    last was early in preg  . Infection    UTI    Patient Active Problem List   Diagnosis Date Noted  . 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 Normal intrauterine pregnancy in third trimester 04/14/2017    Past Surgical History:  Procedure Laterality Date  . CESAREAN SECTION N/A 04/14/2017   Procedure: CESAREAN SECTION;  Surgeon: Myna Hidalgozan, Jennifer, DO;  Location: [facility];  Service: Obstetrics;  Laterality: N/A;  . NO PAST SURGERIES      OB History    Gravida Para Term Preterm AB Living   3 1 1   1 1    SAB TAB Ectopic Multiple Live Births   1     0 1       Home Medications    Prior to Admission medications   Medication Sig Start Date End Date Taking? Authorizing Provider  amoxicillin-clavulanate (AUGMENTIN) 875-125 MG tablet Take 1 tablet by mouth every 12 (twelve) hours for 5 days. 09/06/17 09/11/17  Wieters, Hallie C, PA-C  ferrous sulfate 325 (65 FE) MG tablet Take 1 tablet (325 mg total) by mouth daily with breakfast. 04/17/17   Gerald Leitzole, Tara, MD  ibuprofen (ADVIL,MOTRIN) 600 MG tablet Take 1 tablet (600 mg total) by mouth every 6 (six) hours as  needed. 04/17/17   Gerald Leitzole, Tara, MD  ondansetron (ZOFRAN ODT) 4 MG disintegrating tablet Take 1 tablet (4 mg total) by mouth every 8 (eight) hours as needed for up to 5 days for nausea or vomiting. 09/06/17 09/11/17  Wieters, Hallie C, PA-C  oxyCODONE (OXY IR/ROXICODONE) 5 MG immediate release tablet Take 1 tablet (5 mg total) by mouth every 4 (four) hours as needed (pain scale 4-7). 04/17/17   Gerald Leitzole, Tara, MD  Prenatal Vit-Min-FA-Fish Oil ([facility] PRENATAL GUMMY PO) Take 2 capsules by mouth daily.     [provider]  valACYclovir (VALTREX) 500 MG tablet Take 1,000 mg by mouth daily. Pt took 2 gm for one week before delivery    [provider]    Family History Family History  Problem Relation Age of Onset  . Heart disease Father   . Cancer Maternal Aunt   . Cancer Maternal Grandmother   . Heart disease Paternal Grandmother     Social History Social History   Tobacco Use  . Smoking status: Never Smoker  . Smokeless tobacco: Never Used  Substance Use Topics  . Alcohol use: Yes    Comment: occiasionally not since pregnancy  . Drug use: No     Allergies   Patient has no known allergies.   Review of Systems Review of Systems  Constitutional:  Positive for fatigue.  Respiratory: Positive for cough. Negative for shortness of breath.   Gastrointestinal: Positive for diarrhea, nausea and vomiting.  Musculoskeletal: Negative for myalgias.  All other systems reviewed and are negative.    Physical Exam Updated Vital Signs BP 124/81   Pulse 86   Temp 98.3 F (36.8 C)   Resp 18   Ht 5\' 1"  (1.549 m)   Wt 70.3 kg (155 lb)   LMP 08/30/2017   SpO2 99%   BMI 29.29 kg/m   Physical Exam  Constitutional: She is oriented to person, place, and time. She appears well-developed and well-nourished. No distress.  HENT:  Head: Normocephalic and atraumatic.  Mouth/Throat: Oropharynx is clear and moist. No oropharyngeal exudate.  Neck: Normal range of motion. Neck supple.    Cardiovascular: Normal rate and regular rhythm. Exam reveals no gallop and no friction rub.  No murmur heard. Pulmonary/Chest: Effort normal and breath sounds normal. No respiratory distress. She has no wheezes.  Abdominal: Soft. Bowel sounds are normal. She exhibits no distension. There is no tenderness.  Musculoskeletal: Normal range of motion.  Neurological: She is alert and oriented to person, place, and time.  Skin: Skin is warm and dry. She is not diaphoretic.  Nursing note and vitals reviewed.    ED Treatments / Results  Labs (all labs ordered are listed, but only abnormal results are displayed) Labs Reviewed - No data to display  EKG  EKG Interpretation None       Radiology No results found.  Procedures Procedures (including critical care time)  Medications Ordered in ED Medications - No data to display   Initial Impression / Assessment and Plan / ED Course  I have reviewed the triage vital signs and the nursing notes.  Pertinent labs & imaging results that were available during my care of the patient were reviewed by me and considered in my medical decision making (see chart for details).  Patient presents with complaints that are most likely viral in nature.  I see no indication for further workup or intervention.  I will recommend over-the-counter medications as needed for symptom relief, prescribe Zofran, advised plenty of fluids, and have her follow-up if not improving.  Final Clinical Impressions(s) / ED Diagnoses   Final diagnoses:  None    ED Discharge Orders    None       Geoffery Lyonselo, Iyan Flett, MD 09/09/17 0028

## 2018-02-07 ENCOUNTER — Other Ambulatory Visit (HOSPITAL_COMMUNITY)
Admission: RE | Admit: 2018-02-07 | Discharge: 2018-02-07 | Disposition: A | Discharge status: Home / Self Care | Payer: 59 | Source: Ambulatory Visit | Attending: Obstetrics and Gynecology | Admitting: Obstetrics and Gynecology

## 2018-02-07 ENCOUNTER — Other Ambulatory Visit: Payer: Self-pay | Admitting: Obstetrics and Gynecology

## 2018-02-07 DIAGNOSIS — Z124 Encounter for screening for malignant neoplasm of cervix: Secondary | ICD-10-CM | POA: Diagnosis present

## 2018-02-13 LAB — CYTOLOGY - PAP
Chlamydia: POSITIVE — AB
HPV: DETECTED — AB
Neisseria Gonorrhea: NEGATIVE

## 2018-06-22 ENCOUNTER — Other Ambulatory Visit: Payer: Self-pay

## 2018-06-22 ENCOUNTER — Emergency Department (HOSPITAL_BASED_OUTPATIENT_CLINIC_OR_DEPARTMENT_OTHER): Payer: 59

## 2018-06-22 ENCOUNTER — Encounter (HOSPITAL_BASED_OUTPATIENT_CLINIC_OR_DEPARTMENT_OTHER): Payer: Self-pay

## 2018-06-22 ENCOUNTER — Emergency Department (HOSPITAL_BASED_OUTPATIENT_CLINIC_OR_DEPARTMENT_OTHER)
Admission: EM | Admit: 2018-06-22 | Discharge: 2018-06-23 | Disposition: A | Discharge status: Home / Self Care | Payer: 59 | Attending: Emergency Medicine | Admitting: Emergency Medicine

## 2018-06-22 DIAGNOSIS — K529 Noninfective gastroenteritis and colitis, unspecified: Secondary | ICD-10-CM | POA: Insufficient documentation

## 2018-06-22 DIAGNOSIS — R1033 Periumbilical pain: Secondary | ICD-10-CM | POA: Diagnosis present

## 2018-06-22 DIAGNOSIS — N72 Inflammatory disease of cervix uteri: Secondary | ICD-10-CM | POA: Diagnosis not present

## 2018-06-22 DIAGNOSIS — Z79899 Other long term (current) drug therapy: Secondary | ICD-10-CM | POA: Diagnosis not present

## 2018-06-22 DIAGNOSIS — R319 Hematuria, unspecified: Secondary | ICD-10-CM | POA: Diagnosis not present

## 2018-06-22 DIAGNOSIS — N760 Acute vaginitis: Secondary | ICD-10-CM | POA: Diagnosis not present

## 2018-06-22 DIAGNOSIS — F17290 Nicotine dependence, other tobacco product, uncomplicated: Secondary | ICD-10-CM | POA: Insufficient documentation

## 2018-06-22 DIAGNOSIS — B9689 Other specified bacterial agents as the cause of diseases classified elsewhere: Secondary | ICD-10-CM | POA: Diagnosis not present

## 2018-06-22 DIAGNOSIS — N898 Other specified noninflammatory disorders of vagina: Secondary | ICD-10-CM | POA: Diagnosis not present

## 2018-06-22 LAB — CBC
HEMATOCRIT: 41.1 % (ref 36.0–46.0)
HEMOGLOBIN: 13.4 g/dL (ref 12.0–15.0)
MCH: 26.7 pg (ref 26.0–34.0)
MCHC: 32.6 g/dL (ref 30.0–36.0)
MCV: 82 fL (ref 80.0–100.0)
Platelets: 302 10*3/uL (ref 150–400)
RBC: 5.01 MIL/uL (ref 3.87–5.11)
RDW: 14.3 % (ref 11.5–15.5)
WBC: 13.2 10*3/uL — ABNORMAL HIGH (ref 4.0–10.5)
nRBC: 0 % (ref 0.0–0.2)

## 2018-06-22 LAB — PREGNANCY, URINE: PREG TEST UR: NEGATIVE

## 2018-06-22 LAB — URINALYSIS, ROUTINE W REFLEX MICROSCOPIC
Bilirubin Urine: NEGATIVE
Glucose, UA: NEGATIVE mg/dL
Ketones, ur: >80 mg/dL — AB
NITRITE: NEGATIVE
PROTEIN: NEGATIVE mg/dL
pH: 6 (ref 5.0–8.0)

## 2018-06-22 LAB — WET PREP, GENITAL
SPERM: NONE SEEN
TRICH WET PREP: NONE SEEN
YEAST WET PREP: NONE SEEN

## 2018-06-22 LAB — COMPREHENSIVE METABOLIC PANEL
ALBUMIN: 4.3 g/dL (ref 3.5–5.0)
ALT: 12 U/L (ref 0–44)
ANION GAP: 9 (ref 5–15)
AST: 16 U/L (ref 15–41)
Alkaline Phosphatase: 59 U/L (ref 38–126)
BUN: 20 mg/dL (ref 6–20)
CHLORIDE: 106 mmol/L (ref 98–111)
CO2: 21 mmol/L — AB (ref 22–32)
Calcium: 9.2 mg/dL (ref 8.9–10.3)
Creatinine, Ser: 0.56 mg/dL (ref 0.44–1.00)
GFR calc Af Amer: >60 mL/min (ref >60)
GFR calc non Af Amer: >60 mL/min (ref >60)
GLUCOSE: 107 mg/dL — AB (ref 70–99)
Potassium: 3.8 mmol/L (ref 3.5–5.1)
SODIUM: 136 mmol/L (ref 135–145)
Total Bilirubin: 0.5 mg/dL (ref 0.3–1.2)
Total Protein: 8.1 g/dL (ref 6.5–8.1)

## 2018-06-22 LAB — URINALYSIS, MICROSCOPIC (REFLEX)

## 2018-06-22 LAB — LIPASE, BLOOD: LIPASE: 24 U/L (ref 11–51)

## 2018-06-22 MED ORDER — PROMETHAZINE HCL 25 MG PO TABS: 25.0000 mg | ORAL_TABLET | Freq: Four times a day (QID) | ORAL | 0 refills | Status: DC | PRN

## 2018-06-22 MED ORDER — CEFTRIAXONE SODIUM 250 MG IJ SOLR
250.0000 mg | Freq: Once | INTRAMUSCULAR | Status: AC
  Administered 2018-06-22: 250 mg via INTRAMUSCULAR
  Filled 2018-06-22: qty 250

## 2018-06-22 MED ORDER — IBUPROFEN 800 MG PO TABS
800.0000 mg | ORAL_TABLET | Freq: Once | ORAL | Status: AC
  Administered 2018-06-23: 800 mg via ORAL
  Filled 2018-06-22: qty 1

## 2018-06-22 MED ORDER — MORPHINE SULFATE (PF) 4 MG/ML IV SOLN
4.0000 mg | Freq: Once | INTRAVENOUS | Status: AC
  Administered 2018-06-22: 4 mg via INTRAVENOUS
  Filled 2018-06-22: qty 1

## 2018-06-22 MED ORDER — ONDANSETRON HCL 4 MG/2ML IJ SOLN
4.0000 mg | Freq: Once | INTRAMUSCULAR | Status: AC
  Administered 2018-06-22: 4 mg via INTRAVENOUS
  Filled 2018-06-22: qty 2

## 2018-06-22 MED ORDER — METRONIDAZOLE 500 MG PO TABS: 500.0000 mg | ORAL_TABLET | Freq: Two times a day (BID) | ORAL | 0 refills | Status: DC

## 2018-06-22 MED ORDER — CIPROFLOXACIN HCL 500 MG PO TABS: 500.0000 mg | ORAL_TABLET | Freq: Two times a day (BID) | ORAL | 0 refills | Status: DC

## 2018-06-22 MED ORDER — AZITHROMYCIN 250 MG PO TABS
1000.0000 mg | ORAL_TABLET | Freq: Once | ORAL | Status: AC
  Administered 2018-06-22: 1000 mg via ORAL
  Filled 2018-06-22: qty 4

## 2018-06-22 MED ORDER — IOPAMIDOL (ISOVUE-300) INJECTION 61%
100.0000 mL | Freq: Once | INTRAVENOUS | Status: AC | PRN
  Administered 2018-06-22: 100 mL via INTRAVENOUS

## 2018-06-22 MED ORDER — GI COCKTAIL ~~LOC~~
30.0000 mL | Freq: Once | ORAL | Status: AC
  Administered 2018-06-22: 30 mL via ORAL
  Filled 2018-06-22: qty 30

## 2018-06-22 MED ORDER — SODIUM CHLORIDE 0.9 % IV BOLUS
1000.0000 mL | Freq: Once | INTRAVENOUS | Status: AC
  Administered 2018-06-22: 1000 mL via INTRAVENOUS

## 2018-06-22 NOTE — ED Notes (Signed)
ED Provider at bedside. 

## 2018-06-22 NOTE — Discharge Instructions (Addendum)
You have been evaluated for your condition.  It appears you have inflammation in your intestine and colon.  Take antibiotics as prescribed.  These antibiotic will also cover for bacterial vaginosis and urinary tract infection that was presence during today's exam.  Follow up with your doctor for further care.  Take phenergan as needed for nausea. If your condition worsen, please return as your condition may be early signs of appendicitis.

## 2018-06-22 NOTE — ED Notes (Addendum)
Pt requested staff to not share medical information with any visitors.

## 2018-06-22 NOTE — ED Notes (Signed)
Pt asleep on stretcher. No complaints at this time.

## 2018-06-22 NOTE — ED Provider Notes (Signed)
[facility] EMERGENCY DEPARTMENT Provider Note   CSN: [id number] Arrival date & time: 06/22/18  1729     History   Chief Complaint Chief Complaint  Patient presents with  . Abdominal Pain    HPI Susan Cross is a 27 y.o. female.  The history is provided by the patient. No language interpreter was used.  Abdominal Pain       27 year old female presenting for evaluation of abdominal pain.  Patient report for the past 2 to 3 days she has noticed mild vaginal discharge with blood in the urine.  Discharge is without any significant odor, denies any burning sensation when urinate or having urinary frequency urgency.  Today she endorsed gradual onset of persistent abdominal pain.  Pain is periumbilical, sharp, throbbing, intense, and has become increasingly worse throughout the day.  Any movement, breathing, coughing worsens the pain.  No specific treatment tried.  No fever but endorses chills.  Denies chest pain shortness of breath productive cough or dysuria.  She is not pregnant and does not recall last menstruation since she is on  Mirena.   Past Medical History:  Diagnosis Date  . Anemia   . Genital herpes   . Genital herpes    last was early in preg  . Infection    UTI    Patient Active Problem List   Diagnosis Date Noted  . 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 Normal intrauterine pregnancy in third trimester 04/14/2017    Past Surgical History:  Procedure Laterality Date  . CESAREAN SECTION N/A 04/14/2017   Procedure: CESAREAN SECTION;  Surgeon: Myna Hidalgo, DO;  Location: [facility];  Service: Obstetrics;  Laterality: N/A;  . NO PAST SURGERIES       OB History    Gravida  3   Para  1   Term  1   Preterm      AB  1   Living  1     SAB  1   TAB      Ectopic      Multiple  0   Live Births  1            Home Medications    Prior to Admission medications   Medication Sig Start Date End Date Taking?  Authorizing Provider  ferrous sulfate 325 (65 FE) MG tablet Take 1 tablet (325 mg total) by mouth daily with breakfast. 04/17/17   Gerald Leitz, MD  ibuprofen (ADVIL,MOTRIN) 600 MG tablet Take 1 tablet (600 mg total) by mouth every 6 (six) hours as needed. 04/17/17   Gerald Leitz, MD  ondansetron (ZOFRAN ODT) 8 MG disintegrating tablet 8mg  ODT q4 hours prn nausea 09/09/17   Geoffery Lyons, MD  oxyCODONE (OXY IR/ROXICODONE) 5 MG immediate release tablet Take 1 tablet (5 mg total) by mouth every 4 (four) hours as needed (pain scale 4-7). 04/17/17   Gerald Leitz, MD  Prenatal Vit-Min-FA-Fish Oil ([facility] PRENATAL GUMMY PO) Take 2 capsules by mouth daily.     [provider]  valACYclovir (VALTREX) 500 MG tablet Take 1,000 mg by mouth daily. Pt took 2 gm for one week before delivery    [provider]    Family History Family History  Problem Relation Age of Onset  . Heart disease Father   . Cancer Maternal Aunt   . Cancer Maternal Grandmother   . Heart disease Paternal Grandmother     Social History Social History   Tobacco Use  . Smoking  status: Current Every Day Smoker    Types: Cigars  . Smokeless tobacco: Never Used  Substance Use Topics  . Alcohol use: Yes    Comment: weekly  . Drug use: No     Allergies   Patient has no known allergies.   Review of Systems Review of Systems  Gastrointestinal: Positive for abdominal pain.  All other systems reviewed and are negative.    Physical Exam Updated Vital Signs BP 113/72 (BP Location: Right Arm)   Pulse 93   Temp 98.6 F (37 C) (Oral)   Resp 18   Ht 5\' 1"  (1.549 m)   Wt 59 kg   SpO2 100%   Breastfeeding? Unknown   BMI 24.56 kg/m   Physical Exam  Constitutional: She appears well-developed and well-nourished. No distress.  HENT:  Head: Atraumatic.  Eyes: Conjunctivae are normal.  Neck: Neck supple.  Cardiovascular: Normal rate and regular rhythm.  Pulmonary/Chest: Effort normal and breath sounds normal.    Abdominal: Soft. Normal appearance. There is tenderness (Diffuse abdominal tenderness on exam most significant to periumbilical and right lower quadrant.). There is no guarding.  Genitourinary:  Genitourinary Comments: Chaperone present during exam.  No inguinal adenopathy or inguinal hernia noted.  Normal external genitalia.  Moderate amount of discomfort with speculum insertion.  Difficult to visualize cervical os due to patient's discomfort, small amount of blood noted in vaginal vault.  On bimanual examination, tenderness to cervix but no adnexal tenderness.  Neurological: She is alert.  Skin: No rash noted.  Psychiatric: She has a normal mood and affect.  Nursing note and vitals reviewed.    ED Treatments / Results  Labs (all labs ordered are listed, but only abnormal results are displayed) Labs Reviewed  WET PREP, GENITAL - Abnormal; Notable for the following components:      Result Value   Clue Cells Wet Prep HPF POC PRESENT (*)    WBC, Wet Prep HPF POC MANY (*)    All other components within normal limits  URINALYSIS, ROUTINE W REFLEX MICROSCOPIC - Abnormal; Notable for the following components:   Specific Gravity, Urine >1.030 (*)    Hgb urine dipstick LARGE (*)    Ketones, ur >80 (*)    Leukocytes, UA TRACE (*)    All other components within normal limits  COMPREHENSIVE METABOLIC PANEL - Abnormal; Notable for the following components:   CO2 21 (*)    Glucose, Bld 107 (*)    All other components within normal limits  CBC - Abnormal; Notable for the following components:   WBC 13.2 (*)    All other components within normal limits  URINALYSIS, MICROSCOPIC (REFLEX) - Abnormal; Notable for the following components:   Bacteria, UA FEW (*)    All other components within normal limits  PREGNANCY, URINE  LIPASE, BLOOD  HIV ANTIBODY (ROUTINE TESTING W REFLEX)  RPR  GC/CHLAMYDIA PROBE AMP ([place]) NOT AT [facility]    EKG None  Radiology US Transvaginal Non-ob  Result  Date: 06/22/2018 CLINICAL DATA:  Right lower quadrant pain since 3 a.m. today. IUD for 2 years. EXAM: TRANSABDOMINAL AND TRANSVAGINAL ULTRASOUND OF PELVIS DOPPLER ULTRASOUND OF OVARIES TECHNIQUE: Both transabdominal and transvaginal ultrasound examinations of the pelvis were performed. Transabdominal technique was performed for global imaging of the pelvis including uterus, ovaries, adnexal regions, and pelvic cul-de-sac. It was necessary to proceed with endovaginal exam following the transabdominal exam to visualize the ovaries and endometrium. Color and duplex Doppler ultrasound was utilized to evaluate blood  flow to the ovaries. COMPARISON:  CT abdomen and pelvis 06/22/2018 FINDINGS: Uterus Measurements: 9.3 x 4.6 x 7.6 cm. No fibroids or other mass visualized. Endometrium Thickness: 3.6 mm. Linear echogenic stripe within the endometrium consistent with intrauterine device. Positioning appears appropriate. Right ovary Measurements: 2.9 x 2 x 2.7 cm.  Normal appearance/no adnexal mass. Left ovary Measurements: 3 x 1.9 x 1.8 cm.  Normal appearance/no adnexal mass. Pulsed Doppler evaluation of both ovaries demonstrates normal low-resistance arterial and venous waveforms. Other findings No abnormal free fluid. IMPRESSION: Intrauterine device appears in appropriate position. Otherwise normal appearance of the uterus and ovaries. Electronically Signed   By: Burman Nieves M.D.   On: 06/22/2018 23:35   US Pelvis Complete  Result Date: 06/22/2018 CLINICAL DATA:  Right lower quadrant pain since 3 a.m. today. IUD for 2 years. EXAM: TRANSABDOMINAL AND TRANSVAGINAL ULTRASOUND OF PELVIS DOPPLER ULTRASOUND OF OVARIES TECHNIQUE: Both transabdominal and transvaginal ultrasound examinations of the pelvis were performed. Transabdominal technique was performed for global imaging of the pelvis including uterus, ovaries, adnexal regions, and pelvic cul-de-sac. It was necessary to proceed with endovaginal exam following the  transabdominal exam to visualize the ovaries and endometrium. Color and duplex Doppler ultrasound was utilized to evaluate blood flow to the ovaries. COMPARISON:  CT abdomen and pelvis 06/22/2018 FINDINGS: Uterus Measurements: 9.3 x 4.6 x 7.6 cm. No fibroids or other mass visualized. Endometrium Thickness: 3.6 mm. Linear echogenic stripe within the endometrium consistent with intrauterine device. Positioning appears appropriate. Right ovary Measurements: 2.9 x 2 x 2.7 cm.  Normal appearance/no adnexal mass. Left ovary Measurements: 3 x 1.9 x 1.8 cm.  Normal appearance/no adnexal mass. Pulsed Doppler evaluation of both ovaries demonstrates normal low-resistance arterial and venous waveforms. Other findings No abnormal free fluid. IMPRESSION: Intrauterine device appears in appropriate position. Otherwise normal appearance of the uterus and ovaries. Electronically Signed   By: Burman Nieves M.D.   On: 06/22/2018 23:35   Ct Abdomen Pelvis W Contrast  Result Date: 06/22/2018 CLINICAL DATA:  RIGHT-sided abdominal pain for 12 hours, sharp pain. EXAM: CT ABDOMEN AND PELVIS WITH CONTRAST TECHNIQUE: Multidetector CT imaging of the abdomen and pelvis was performed using the standard protocol following bolus administration of intravenous contrast. CONTRAST:  ISOVUE-300 IOPAMIDOL (ISOVUE-300) INJECTION 61% COMPARISON:  None. FINDINGS: Lower chest: No acute abnormality. Hepatobiliary: Scattered hypodense masslike lesions within the liver, largest at the dome of the RIGHT liver lobe measuring 2.3 cm, some difficult to characterize due to small size and phase of contrast enhancement, suspected hemangiomas and/or cysts. Gallbladder appears normal.  No bile duct dilatation seen. Pancreas: Unremarkable. No pancreatic ductal dilatation or surrounding inflammatory changes. Spleen: Normal in size without focal abnormality. Adrenals/Urinary Tract: Adrenal glands appear normal. Kidneys appear normal without mass, stone or  hydronephrosis. No ureteral or bladder calculi identified. Bladder appears normal. Stomach/Bowel: No dilated large or small bowel loops. No convincing evidence of bowel wall inflammation. Fluid is present throughout a significant portion of the small and large bowel which can be a secondary sign of gastroenteritis or enterocolitis. Appendix is not confidently seen due to multiple overlapping non-opacified small and large bowel loops in the RIGHT lower quadrant. However, there is no free fluid or obvious inflammatory change appreciated in the RIGHT lower quadrant. Vascular/Lymphatic: No significant vascular findings are present. No enlarged abdominal or pelvic lymph nodes. Reproductive: Intrauterine device appears appropriately positioned within the uterus. No mass or free fluid appreciated within either adnexal region. Other: No free fluid or abscess collection  identified. No free intraperitoneal air. Musculoskeletal: No acute or suspicious osseous finding. IMPRESSION: 1. No convincing source for RIGHT-sided abdominal pain identified. No renal or ureteral calculi. No bowel obstruction or evidence of bowel wall inflammation. There is fluid throughout a significant portion of the small and large bowel which can be a secondary sign of gastroenteritis or enterocolitis. 2. Appendix is not seen, therefore, an early acute appendicitis cannot be confidently excluded. However, there is no free fluid, pericecal inflammation or other secondary signs of appendicitis. If any clinical concern for early acute appendicitis, would recommend close clinical follow-up and could consider follow-up CT with oral contrast. 3. Multiple hypodense masslike lesions within the liver, of uncertain etiology but suspected to represent a combination of benign hemangiomas and cysts. Patient has a known benign hemangioma within the RIGHT liver lobe, per previous ultrasound report. Electronically Signed   By: Bary Richard M.D.   On: 06/22/2018 21:23     [place] Art/ven Flow Abd Pelv Doppler  Result Date: 06/22/2018 CLINICAL DATA:  Right lower quadrant pain since 3 a.m. today. IUD for 2 years. EXAM: TRANSABDOMINAL AND TRANSVAGINAL ULTRASOUND OF PELVIS DOPPLER ULTRASOUND OF OVARIES TECHNIQUE: Both transabdominal and transvaginal ultrasound examinations of the pelvis were performed. Transabdominal technique was performed for global imaging of the pelvis including uterus, ovaries, adnexal regions, and pelvic cul-de-sac. It was necessary to proceed with endovaginal exam following the transabdominal exam to visualize the ovaries and endometrium. Color and duplex Doppler ultrasound was utilized to evaluate blood flow to the ovaries. COMPARISON:  CT abdomen and pelvis 06/22/2018 FINDINGS: Uterus Measurements: 9.3 x 4.6 x 7.6 cm. No fibroids or other mass visualized. Endometrium Thickness: 3.6 mm. Linear echogenic stripe within the endometrium consistent with intrauterine device. Positioning appears appropriate. Right ovary Measurements: 2.9 x 2 x 2.7 cm.  Normal appearance/no adnexal mass. Left ovary Measurements: 3 x 1.9 x 1.8 cm.  Normal appearance/no adnexal mass. Pulsed Doppler evaluation of both ovaries demonstrates normal low-resistance arterial and venous waveforms. Other findings No abnormal free fluid. IMPRESSION: Intrauterine device appears in appropriate position. Otherwise normal appearance of the uterus and ovaries. Electronically Signed   By: Burman Nieves M.D.   On: 06/22/2018 23:35    Procedures Pelvic exam Date/Time: 06/22/2018 6:44 PM Performed by: Fayrene Helper, PA-C Authorized by: Fayrene Helper, PA-C  Consent: Verbal consent obtained. Consent given by: patient Patient identity confirmed: verbally with patient Local anesthesia used: no  Anesthesia: Local anesthesia used: no  Sedation: Patient sedated: no  Patient tolerance of procedure: patient tolerates with difficulty.    (including critical care time)  Medications Ordered in  ED Medications  ibuprofen (ADVIL,MOTRIN) tablet 800 mg (has no administration in time range)  gi cocktail (Maalox,Lidocaine,Donnatal) (30 mLs Oral Given 06/22/18 1817)  morphine 4 MG/ML injection 4 mg (4 mg Intravenous Given 06/22/18 1818)  ondansetron (ZOFRAN) injection 4 mg (4 mg Intravenous Given 06/22/18 1818)  sodium chloride 0.9 % bolus 1,000 mL (0 mLs Intravenous Stopped 06/22/18 1921)  cefTRIAXone (ROCEPHIN) injection 250 mg (250 mg Intramuscular Given 06/22/18 1854)  azithromycin (ZITHROMAX) tablet 1,000 mg (1,000 mg Oral Given 06/22/18 1853)  iopamidol (ISOVUE-300) 61 % injection 100 mL (100 mLs Intravenous Contrast Given 06/22/18 2048)  ondansetron (ZOFRAN) injection 4 mg (4 mg Intravenous Given 06/22/18 2132)  morphine 4 MG/ML injection 4 mg (4 mg Intravenous Given 06/22/18 2132)     Initial Impression / Assessment and Plan / ED Course  I have reviewed the triage vital signs and the nursing notes.  Pertinent labs & imaging results that were available during my care of the patient were reviewed by me and considered in my medical decision making (see chart for details).     BP 103/63 (BP Location: Right Arm)   Pulse 80   Temp 98.6 F (37 C) (Oral)   Resp 18   Ht 5\' 1"  (1.549 m)   Wt 59 kg   SpO2 99%   Breastfeeding? Unknown Comment: neg urine preg test  BMI 24.56 kg/m    Final Clinical Impressions(s) / ED Diagnoses   Final diagnoses:  Gastroenteritis  Cervicitis  BV (bacterial vaginosis)    ED Discharge Orders         Ordered    ciprofloxacin (CIPRO) 500 MG tablet  2 times daily     06/22/18 2355    metroNIDAZOLE (FLAGYL) 500 MG tablet  2 times daily     06/22/18 2355    promethazine (PHENERGAN) 25 MG tablet  Every 6 hours PRN     06/22/18 2355         6:25 PM Patient here complaining of abdominal pain along with hematuria and vaginal discharge.  She appears uncomfortable with diffuse abdominal tenderness on exam.  Urine shows large amounts of  hemoglobin and urine dipsticks with greater than 80 ketones but no signs of urinary tract infection.  She also had an elevated white count of 13.2.  6:45 PM On pelvic examination, pt does have vaginal bleeding, difficult to visualize cervical os due to discomfort.  Will give rocephin/zithromax.  Plan to obtain abd/pelvis CT for further evaluation.    10:36 PM Abdominal and pelvis CT scan shows no convincing source of right-sided abdominal pain.  There is fluid throughout significant portions of the small and large bowel which may be secondary signs of gastroenteritis or enterocolitis.  Appendix is not seen therefore early appendicitis cannot be excluded but otherwise no other inflammatory changes to suggest appendicitis.  There are multiple hypodense masslike lesion within the liver likely benign hemangiomas and cyst.  Since patient also had discomfort with pelvic examination, a pelvic ultrasound ordered to ensure patient does not have evidence of ovarian torsion or tubo-ovarian abscess.  11:18 PM UA shows 21-50 WBC.  Wet prep shows presence of clue cells and many WBC.  Mild leukocytosis with WBC 13.2.  Since CT scan shows gastroenteritis or enterocolitis I felt patient would benefit from Cipro and Flagyl to cover for potential urinary tract infection as well as colitis.  Pelvic and transvaginal ultrasound without any acute concerning feature.  Specifically no evidence of ovarian torsion or tubo-ovarian abscess.  Patient felt reassured.  She is stable for discharge. Pt understand her condition may also could be early signs of appendicitis therefore she should return for serial abdominal exam and repeat CT scan if her condition worsen.     Fayrene Helper, PA-C 06/22/18 2358    Tilden Fossa, MD 06/23/18 [phone]

## 2018-06-22 NOTE — ED Triage Notes (Addendum)
C/o right side abd pain x today "little diarrhea"-NAD-to triage in w/c-drove self to ED

## 2018-06-22 NOTE — ED Notes (Signed)
Pt asleep on stretcher and in NAD at this time.

## 2018-06-22 NOTE — ED Notes (Signed)
Patient transported to Ultrasound 

## 2018-06-24 LAB — RPR: RPR Ser Ql: NONREACTIVE

## 2018-06-24 LAB — HIV ANTIBODY (ROUTINE TESTING W REFLEX): HIV SCREEN 4TH GENERATION: NONREACTIVE

## 2018-06-25 LAB — GC/CHLAMYDIA PROBE AMP (~~LOC~~) NOT AT ARMC
Chlamydia: NEGATIVE
NEISSERIA GONORRHEA: POSITIVE — AB

## 2018-06-27 ENCOUNTER — Encounter (HOSPITAL_BASED_OUTPATIENT_CLINIC_OR_DEPARTMENT_OTHER): Payer: Self-pay | Admitting: *Deleted

## 2018-06-27 ENCOUNTER — Emergency Department (HOSPITAL_BASED_OUTPATIENT_CLINIC_OR_DEPARTMENT_OTHER)
Admission: EM | Admit: 2018-06-27 | Discharge: 2018-06-27 | Disposition: A | Discharge status: Home / Self Care | Payer: 59 | Attending: Emergency Medicine | Admitting: Emergency Medicine

## 2018-06-27 ENCOUNTER — Other Ambulatory Visit: Payer: Self-pay

## 2018-06-27 DIAGNOSIS — Z79899 Other long term (current) drug therapy: Secondary | ICD-10-CM | POA: Diagnosis not present

## 2018-06-27 DIAGNOSIS — F17290 Nicotine dependence, other tobacco product, uncomplicated: Secondary | ICD-10-CM | POA: Diagnosis not present

## 2018-06-27 DIAGNOSIS — A5424 Gonococcal female pelvic inflammatory disease: Secondary | ICD-10-CM

## 2018-06-27 DIAGNOSIS — A549 Gonococcal infection, unspecified: Secondary | ICD-10-CM | POA: Diagnosis not present

## 2018-06-27 DIAGNOSIS — N730 Acute parametritis and pelvic cellulitis: Secondary | ICD-10-CM | POA: Diagnosis not present

## 2018-06-27 DIAGNOSIS — R1031 Right lower quadrant pain: Secondary | ICD-10-CM | POA: Diagnosis present

## 2018-06-27 LAB — URINALYSIS, ROUTINE W REFLEX MICROSCOPIC
Bilirubin Urine: NEGATIVE
Glucose, UA: NEGATIVE mg/dL
Hgb urine dipstick: NEGATIVE
Ketones, ur: 15 mg/dL — AB
LEUKOCYTES UA: NEGATIVE
NITRITE: NEGATIVE
PH: 6.5 (ref 5.0–8.0)
Protein, ur: NEGATIVE mg/dL
SPECIFIC GRAVITY, URINE: 1.025 (ref 1.005–1.030)

## 2018-06-27 LAB — WET PREP, GENITAL
Sperm: NONE SEEN
Trich, Wet Prep: NONE SEEN
YEAST WET PREP: NONE SEEN

## 2018-06-27 LAB — COMPREHENSIVE METABOLIC PANEL
ALT: 13 U/L (ref 0–44)
ANION GAP: 10 (ref 5–15)
AST: 18 U/L (ref 15–41)
Albumin: 4.3 g/dL (ref 3.5–5.0)
Alkaline Phosphatase: 50 U/L (ref 38–126)
BILIRUBIN TOTAL: 0.7 mg/dL (ref 0.3–1.2)
BUN: 11 mg/dL (ref 6–20)
CHLORIDE: 105 mmol/L (ref 98–111)
CO2: 22 mmol/L (ref 22–32)
Calcium: 9.1 mg/dL (ref 8.9–10.3)
Creatinine, Ser: 0.52 mg/dL (ref 0.44–1.00)
Glucose, Bld: 78 mg/dL (ref 70–99)
POTASSIUM: 4 mmol/L (ref 3.5–5.1)
Sodium: 137 mmol/L (ref 135–145)
Total Protein: 8.4 g/dL — ABNORMAL HIGH (ref 6.5–8.1)

## 2018-06-27 LAB — CBC
HEMATOCRIT: 46.3 % — AB (ref 36.0–46.0)
HEMOGLOBIN: 14.6 g/dL (ref 12.0–15.0)
MCH: 26.3 pg (ref 26.0–34.0)
MCHC: 31.5 g/dL (ref 30.0–36.0)
MCV: 83.3 fL (ref 80.0–100.0)
NRBC: 0 % (ref 0.0–0.2)
Platelets: 347 10*3/uL (ref 150–400)
RBC: 5.56 MIL/uL — ABNORMAL HIGH (ref 3.87–5.11)
RDW: 14.1 % (ref 11.5–15.5)
WBC: 6.3 10*3/uL (ref 4.0–10.5)

## 2018-06-27 LAB — PREGNANCY, URINE: Preg Test, Ur: NEGATIVE

## 2018-06-27 LAB — LIPASE, BLOOD: LIPASE: 23 U/L (ref 11–51)

## 2018-06-27 MED ORDER — DOXYCYCLINE HYCLATE 100 MG PO TABS
100.0000 mg | ORAL_TABLET | Freq: Once | ORAL | Status: AC
  Administered 2018-06-27: 100 mg via ORAL
  Filled 2018-06-27: qty 1

## 2018-06-27 MED ORDER — SODIUM CHLORIDE 0.9 % IV BOLUS (SEPSIS)
1000.0000 mL | Freq: Once | INTRAVENOUS | Status: AC
  Administered 2018-06-27: 1000 mL via INTRAVENOUS

## 2018-06-27 MED ORDER — SODIUM CHLORIDE 0.9 % IV SOLN
1000.0000 mL | INTRAVENOUS | Status: DC
  Administered 2018-06-27: 1000 mL via INTRAVENOUS

## 2018-06-27 MED ORDER — ONDANSETRON 4 MG PO TBDP: 4.0000 mg | ORAL_TABLET | ORAL | 0 refills | Status: DC | PRN

## 2018-06-27 MED ORDER — KETOROLAC TROMETHAMINE 30 MG/ML IJ SOLN
30.0000 mg | Freq: Once | INTRAMUSCULAR | Status: AC
  Administered 2018-06-27: 30 mg via INTRAVENOUS
  Filled 2018-06-27: qty 1

## 2018-06-27 MED ORDER — DOXYCYCLINE HYCLATE 100 MG PO CAPS: 100.0000 mg | ORAL_CAPSULE | Freq: Two times a day (BID) | ORAL | 0 refills | Status: AC

## 2018-06-27 MED ORDER — OXYCODONE-ACETAMINOPHEN 5-325 MG PO TABS: 2.0000 | ORAL_TABLET | Freq: Four times a day (QID) | ORAL | 0 refills | Status: DC | PRN

## 2018-06-27 MED ORDER — MORPHINE SULFATE (PF) 4 MG/ML IV SOLN
4.0000 mg | Freq: Once | INTRAVENOUS | Status: AC
  Administered 2018-06-27: 4 mg via INTRAVENOUS
  Filled 2018-06-27: qty 1

## 2018-06-27 MED ORDER — OXYCODONE-ACETAMINOPHEN 5-325 MG PO TABS
2.0000 | ORAL_TABLET | Freq: Once | ORAL | Status: AC
  Administered 2018-06-27: 2 via ORAL
  Filled 2018-06-27: qty 2

## 2018-06-27 MED ORDER — IBUPROFEN 800 MG PO TABS: 800.0000 mg | ORAL_TABLET | Freq: Three times a day (TID) | ORAL | 0 refills | Status: DC

## 2018-06-27 MED ORDER — ONDANSETRON HCL 4 MG/2ML IJ SOLN
4.0000 mg | Freq: Once | INTRAMUSCULAR | Status: AC
  Administered 2018-06-27: 4 mg via INTRAVENOUS
  Filled 2018-06-27: qty 2

## 2018-06-27 MED ORDER — METRONIDAZOLE 500 MG PO TABS: 500.0000 mg | ORAL_TABLET | Freq: Two times a day (BID) | ORAL | 0 refills | Status: DC

## 2018-06-27 MED ORDER — SODIUM CHLORIDE 0.9 % IV SOLN
1.0000 g | Freq: Once | INTRAVENOUS | Status: AC
  Administered 2018-06-27: 1 g via INTRAVENOUS
  Filled 2018-06-27: qty 10

## 2018-06-27 MED FILL — OXYCODONE-ACETAMINOPHEN 5-3: 5-325 | 2 days supply | Qty: 12 | Fill #0

## 2018-06-27 MED FILL — ONDANSETRON ODT 4 MG TABLET: 4 | 3 days supply | Qty: 20 | Fill #0

## 2018-06-27 MED FILL — IBUPROFEN 800 MG TAB: 800 | 7 days supply | Qty: 21 | Fill #0

## 2018-06-27 MED FILL — DOXYCYCLINE HYCLATE 100 MG: 100 | 14 days supply | Qty: 28 | Fill #0

## 2018-06-27 NOTE — ED Notes (Signed)
ED Provider at bedside. 

## 2018-06-27 NOTE — ED Notes (Signed)
Family at bedside. 

## 2018-06-27 NOTE — Discharge Instructions (Addendum)
1.  Continue your Flagyl as prescribed.  That medication should be taken for a total of 2 weeks. 2.  Discontinue your ciprofloxacin. 3.  Start doxycycline twice daily.  This will be continued for 2 weeks. 4.  Take ibuprofen every 8 hours for pain control.  Take 1-2 Percocet in addition to this if needed every 6 hours.  Take Zofran every 4 hours if needed for nausea. 5.  If you develop a fever, worsening or uncontrolled pain, general weakness or generally worsening condition, you are to go to [facility] hospital for evaluation.  You should have a recheck with a gynecologist within 24 hours per my discussion with the provider at [facility].  If you cannot be seen by your gynecologist, [facility]

## 2018-06-27 NOTE — ED Triage Notes (Signed)
Pt reports ongoing abd pain with nausea and diarrhea, seen here and dx with gastritis and bv on Friday, cont with pain to lower abd area.

## 2018-06-27 NOTE — Consult Note (Signed)
OB/GYN Telephone Consult 06/27/18   Called by Arby Barrette MD Camara Renstrom is a 27 y.o. G3P1011 not pregnant presenting to [facility] for abdominal pain. She was seen on 10/11 and treated with Ctx and Azithromycin empirically for pelvic infection and BV. She has a Mirena IUD in place. She returned to [facility] for continued severe abdominal pain.  Since being seen on 10/11 the patient has been on ciprofloxacin and flagyl. MD provider had reported  TVUS was negative for TOA   I was called for a consult regarding the care of this patient by [facility]                                                          .   The provider had a clinical question about  1) Whether the patient needed to have IUD removed 2) Whether patient necessitates inpatient vs outpatient treatment.   The provider presented the following relevant clinical information: History/physical Recently positive gonorrhea NAAT from 10/11  CBC    Component Value Date/Time   WBC 6.3 06/27/2018 1144   RBC 5.56 (H) 06/27/2018 1144   HGB 14.6 06/27/2018 1144   HCT 46.3 (H) 06/27/2018 1144   PLT 347 06/27/2018 1144   MCV 83.3 06/27/2018 1144   MCH 26.3 06/27/2018 1144   MCHC 31.5 06/27/2018 1144   RDW 14.1 06/27/2018 1144   LYMPHSABS 1.9 10/15/2016 2320   MONOABS 0.6 10/15/2016 2320   EOSABS 0.1 10/15/2016 2320   BASOSABS 0.0 10/15/2016 2320     I performed a chart review on the patient and reviewed available documentation.  BP 105/76 (BP Location: Left Arm)   Pulse 80   Temp 98.6 F (37 C) (Oral)   Resp 16   Ht 5\' 1"  (1.549 m)   Wt 59 kg   SpO2 100%   BMI 24.56 kg/m   Exam- performed by consulting provider Of Note-- Patient with CMT, mucopurlent discharge on exam.  Pain in RUQ  Recommendations:  Reviewed the criteria for admission/parental abx which include severe clinical illness/high fever, intractable nausea/vomiting,  sign of sepsis, TOA, inability to tolerate PO, lack of  response to oral medication. Currently this patient has not failed PID outpatient/oral treatment based on not being on an evidence based reigmen for [facility]. Additionally GC is often cirpofloxacin resistant.  TVUS on 10/11 did not show TOA--- when consulted I was under the impression that the patient had had imaging today. However her WBC is WNL today and thus lower risk of TOA development.   CDC does not routinely recommend removal of the IUD unless infection is not responding to antibiotic therapy which patient had not been on the best course of treatment.  Patient provider IUD   I recommended the following with recheck in 24 hrs or sooner if not improving.   Ceftriaxone IV  Doxycycline 100mg  BID x 14 day  Continue Flagyl for addition 7 days (total treatment of 14d)  Proceed to MAU if worsening pain in the next 24 hrs.   4:59 PM On review of the record I realized the provider had not done any imaging today. I called [facility] and attempted to speak to Dr. Broadus John who has left [facility]. The current physician was not  aware of patient and RN said she was discharged  5:20 PM Call patient at mobile number- received busy signal.   Thank you for this consult and if additional recommendations are needed please call [phone] for the OB/GYN attending on service at [facility].   I spent approximately 10 minutes directly consulting with the provider and verbally discussing this case. Additionally 25 minutes minutes was spent performing chart review and documentation.    Federico Flake, MD   Criteria for phone consult billing? (If answer to any of these are yes then you cannot bill this telephone consult) Will the patient be seen urgently (within 24hrs) at a [facility] practice? No Is this a patient on which I performed surgery within the last 7d? No Have you billed a telephone consult on this patient in the last 7d? No   CPT Code (based on total time, direct  and indirect) 99443= 21-30 min

## 2018-06-27 NOTE — ED Provider Notes (Signed)
[facility] EMERGENCY DEPARTMENT Provider Note   CSN: [id number] Arrival date & time: 06/27/18  0854     History   Chief Complaint Chief Complaint  Patient presents with  . Abdominal Pain    HPI Susan Cross is a 27 y.o. female.  HPI Reports that she has had abdominal pain for about 7 days.  She reports it is severe.  It has continued and perhaps worsened since she was seen in the emergency department on 10\11.  Is mostly to the right side and goes into her upper abdomen.  She reports she cannot get comfortable at night.  She is in constant pain.  Reports she has nausea.  Patient has a IUD.  Denies she is having ongoing bleeding.  Had a C-section no other surgical history. Past Medical History:  Diagnosis Date  . Anemia   . Genital herpes   . Genital herpes    last was early in preg  . Infection    UTI    Patient Active Problem List   Diagnosis Date Noted  . 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 Normal intrauterine pregnancy in third trimester 04/14/2017    Past Surgical History:  Procedure Laterality Date  . CESAREAN SECTION N/A 04/14/2017   Procedure: CESAREAN SECTION;  Surgeon: Myna Hidalgo, DO;  Location: [facility];  Service: Obstetrics;  Laterality: N/A;  . NO PAST SURGERIES       OB History    Gravida  3   Para  1   Term  1   Preterm      AB  1   Living  1     SAB  1   TAB      Ectopic      Multiple  0   Live Births  1            Home Medications    Prior to Admission medications   Medication Sig Start Date End Date Taking? Authorizing Provider  ciprofloxacin (CIPRO) 500 MG tablet Take 1 tablet (500 mg total) by mouth 2 (two) times daily. One po bid x 7 days 06/22/18   Fayrene Helper, PA-C  doxycycline (VIBRAMYCIN) 100 MG capsule Take 1 capsule (100 mg total) by mouth 2 (two) times daily for 14 days. One po bid x 7 days 06/27/18 07/11/18  Arby Barrette, MD  ferrous sulfate 325 (65 FE) MG tablet Take  1 tablet (325 mg total) by mouth daily with breakfast. 04/17/17   Gerald Leitz, MD  ibuprofen (ADVIL,MOTRIN) 600 MG tablet Take 1 tablet (600 mg total) by mouth every 6 (six) hours as needed. 04/17/17   Gerald Leitz, MD  ibuprofen (ADVIL,MOTRIN) 800 MG tablet Take 1 tablet (800 mg total) by mouth 3 (three) times daily. 06/27/18   Arby Barrette, MD  metroNIDAZOLE (FLAGYL) 500 MG tablet Take 1 tablet (500 mg total) by mouth 2 (two) times daily. One po bid x 7 days 06/22/18   Fayrene Helper, PA-C  metroNIDAZOLE (FLAGYL) 500 MG tablet Take 1 tablet (500 mg total) by mouth 2 (two) times daily. One po bid x 7 days 06/27/18   Arby Barrette, MD  ondansetron (ZOFRAN ODT) 4 MG disintegrating tablet Take 1 tablet (4 mg total) by mouth every 4 (four) hours as needed for nausea or vomiting. 06/27/18   Arby Barrette, MD  ondansetron (ZOFRAN ODT) 8 MG disintegrating tablet 8mg  ODT q4 hours prn nausea 09/09/17   Geoffery Lyons, MD  oxyCODONE (OXY IR/ROXICODONE) 5 MG immediate  release tablet Take 1 tablet (5 mg total) by mouth every 4 (four) hours as needed (pain scale 4-7). 04/17/17   Gerald Leitz, MD  oxyCODONE-acetaminophen (PERCOCET) 5-325 MG tablet Take 2 tablets by mouth every 6 (six) hours as needed. 06/27/18   Arby Barrette, MD  Prenatal Vit-Min-FA-Fish Oil ([employer] PRENATAL GUMMY PO) Take 2 capsules by mouth daily.     [provider]  promethazine (PHENERGAN) 25 MG tablet Take 1 tablet (25 mg total) by mouth every 6 (six) hours as needed for nausea. 06/22/18   Fayrene Helper, PA-C  valACYclovir (VALTREX) 500 MG tablet Take 1,000 mg by mouth daily. Pt took 2 gm for one week before delivery    [provider]    Family History Family History  Problem Relation Age of Onset  . Heart disease Father   . Cancer Maternal Aunt   . Cancer Maternal Grandmother   . Heart disease Paternal Grandmother     Social History Social History   Tobacco Use  . Smoking status: Current Every Day Smoker    Types:  Cigars  . Smokeless tobacco: Never Used  Substance Use Topics  . Alcohol use: Yes    Comment: weekly  . Drug use: No     Allergies   Patient has no known allergies.   Review of Systems Review of Systems 10 Systems reviewed and are negative for acute change except as noted in the HPI.  Physical Exam Updated Vital Signs BP 105/76 (BP Location: Left Arm)   Pulse 80   Temp 98.6 F (37 C) (Oral)   Resp 16   Ht 5\' 1"  (1.549 m)   Wt 59 kg   SpO2 100%   BMI 24.56 kg/m   Physical Exam  Constitutional: She is oriented to person, place, and time.  Is alert and nontoxic.  No respiratory distress.  Patient does appear to be in significant pain.  HENT:  Head: Normocephalic and atraumatic.  Mouth/Throat: Oropharynx is clear and moist.  Eyes: EOM are normal.  Cardiovascular: Normal rate, regular rhythm, normal heart sounds and intact distal pulses.  Pulmonary/Chest: Effort normal and breath sounds normal.  Abdominal:  Patient has moderate to severe tenderness suprapubic and right lower and lateral quadrants.  No guarding.  Genitourinary:  Genitourinary Comments: Normal external female genitalia. Spec Exam: copious purulent d/c from cervix. Severe pain to uterine palpation, created pain that radiated toward right upper quadrant  Musculoskeletal: Normal range of motion. She exhibits no edema or tenderness.  Neurological: She is alert and oriented to person, place, and time. She exhibits normal muscle tone. Coordination normal.  Skin: Skin is warm and dry.  Psychiatric: She has a normal mood and affect.     ED Treatments / Results  Labs (all labs ordered are listed, but only abnormal results are displayed) Labs Reviewed  WET PREP, GENITAL - Abnormal; Notable for the following components:      Result Value   Clue Cells Wet Prep HPF POC PRESENT (*)    WBC, Wet Prep HPF POC MANY (*)    All other components within normal limits  COMPREHENSIVE METABOLIC PANEL - Abnormal; Notable  for the following components:   Total Protein 8.4 (*)    All other components within normal limits  CBC - Abnormal; Notable for the following components:   RBC 5.56 (*)    HCT 46.3 (*)    All other components within normal limits  URINALYSIS, ROUTINE W REFLEX MICROSCOPIC - Abnormal; Notable for the  following components:   Ketones, ur 15 (*)    All other components within normal limits  LIPASE, BLOOD  PREGNANCY, URINE  GC/CHLAMYDIA PROBE AMP ([place]) NOT AT [facility]    EKG None  Radiology No results found.  Procedures Procedures (including critical care time)  Medications Ordered in ED Medications  sodium chloride 0.9 % bolus 1,000 mL (0 mLs Intravenous Stopped 06/27/18 1325)    Followed by  0.9 %  sodium chloride infusion (1,000 mLs Intravenous New Bag/Given 06/27/18 1325)  morphine 4 MG/ML injection 4 mg (4 mg Intravenous Given 06/27/18 1238)  ketorolac (TORADOL) 30 MG/ML injection 30 mg (30 mg Intravenous Given 06/27/18 1238)  ondansetron (ZOFRAN) injection 4 mg (4 mg Intravenous Given 06/27/18 1309)  morphine 4 MG/ML injection 4 mg (4 mg Intravenous Given 06/27/18 1323)  cefTRIAXone (ROCEPHIN) 1 g in sodium chloride 0.9 % 100 mL IVPB (1 g Intravenous New Bag/Given 06/27/18 1553)  doxycycline (VIBRA-TABS) tablet 100 mg (100 mg Oral Given 06/27/18 1605)  oxyCODONE-acetaminophen (PERCOCET/ROXICET) 5-325 MG per tablet 2 tablet (2 tablets Oral Given 06/27/18 1605)     Initial Impression / Assessment and Plan / ED Course  I have reviewed the triage vital signs and the nursing notes.  Pertinent labs & imaging results that were available during my care of the patient were reviewed by me and considered in my medical decision making (see chart for details).  Clinical Course as of Jun 27 1625  Wed Jun 27, 2018  1602 Consult: Reviewed with covering GYN Dr. Alvester Morin.  She advised that patient has not had full treatment for PID.  His white count is normal, vital signs stable and  afebrile, recommendation is for Rocephin 1 g IV, doxycycline 100 mg twice daily for 14 days and a complete course of Flagyl for 14 days.  If patient is not improving within the next 24 hours, she is to go to [facility] hospital.   [MP]    Clinical Course User Index [MP] Arby Barrette, MD   Patient had worsening pain despite treatment 5 days ago.  Lamination she still has copious purulent drainage from the cervix and findings consistent with PID.  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  I have reviewed this with Dr. Alvester Morin.  We will proceed with plan as outlined.  She is agreeable with this plan.  She is alert and appropriate.  She understands that she needs follow-up within 24 hours and the return precautions if she should be worsening.  He is here with her mother who will also be with her to help.  Final Clinical Impressions(s) / ED Diagnoses   Final diagnoses:  PID (acute pelvic inflammatory disease)  Gonorrhea    ED Discharge Orders         Ordered    oxyCODONE-acetaminophen (PERCOCET) 5-325 MG tablet  Every 6 hours PRN     06/27/18 1602    doxycycline (VIBRAMYCIN) 100 MG capsule  2 times daily     06/27/18 1602    metroNIDAZOLE (FLAGYL) 500 MG tablet  2 times daily     06/27/18 1602    ibuprofen (ADVIL,MOTRIN) 800 MG tablet  3 times daily     06/27/18 1602    ondansetron (ZOFRAN ODT) 4 MG disintegrating tablet  Every 4 hours PRN     06/27/18 1602           Arby Barrette, MD 06/27/18 1631

## 2018-06-28 LAB — GC/CHLAMYDIA PROBE AMP (~~LOC~~) NOT AT ARMC
CHLAMYDIA, DNA PROBE: NEGATIVE
NEISSERIA GONORRHEA: POSITIVE — AB

## 2018-08-13 ENCOUNTER — Other Ambulatory Visit: Payer: Self-pay | Admitting: Gastroenterology

## 2018-08-13 ENCOUNTER — Encounter (HOSPITAL_BASED_OUTPATIENT_CLINIC_OR_DEPARTMENT_OTHER): Payer: Self-pay | Admitting: Emergency Medicine

## 2018-08-13 ENCOUNTER — Emergency Department (HOSPITAL_BASED_OUTPATIENT_CLINIC_OR_DEPARTMENT_OTHER)
Admission: EM | Admit: 2018-08-13 | Discharge: 2018-08-13 | Disposition: A | Discharge status: Home / Self Care | Payer: 59 | Attending: Emergency Medicine | Admitting: Emergency Medicine

## 2018-08-13 ENCOUNTER — Other Ambulatory Visit: Payer: Self-pay

## 2018-08-13 DIAGNOSIS — A6000 Herpesviral infection of urogenital system, unspecified: Secondary | ICD-10-CM

## 2018-08-13 DIAGNOSIS — N898 Other specified noninflammatory disorders of vagina: Secondary | ICD-10-CM | POA: Diagnosis present

## 2018-08-13 DIAGNOSIS — F17290 Nicotine dependence, other tobacco product, uncomplicated: Secondary | ICD-10-CM | POA: Diagnosis not present

## 2018-08-13 DIAGNOSIS — A6004 Herpesviral vulvovaginitis: Secondary | ICD-10-CM | POA: Diagnosis not present

## 2018-08-13 DIAGNOSIS — Z79899 Other long term (current) drug therapy: Secondary | ICD-10-CM | POA: Insufficient documentation

## 2018-08-13 DIAGNOSIS — R1031 Right lower quadrant pain: Secondary | ICD-10-CM

## 2018-08-13 LAB — URINALYSIS, MICROSCOPIC (REFLEX)

## 2018-08-13 LAB — WET PREP, GENITAL
Clue Cells Wet Prep HPF POC: NONE SEEN
Sperm: NONE SEEN
TRICH WET PREP: NONE SEEN
WBC, Wet Prep HPF POC: NONE SEEN
Yeast Wet Prep HPF POC: NONE SEEN

## 2018-08-13 LAB — URINALYSIS, ROUTINE W REFLEX MICROSCOPIC
Bilirubin Urine: NEGATIVE
Glucose, UA: NEGATIVE mg/dL
Ketones, ur: NEGATIVE mg/dL
Nitrite: NEGATIVE
Protein, ur: NEGATIVE mg/dL
Specific Gravity, Urine: 1.025 (ref 1.005–1.030)
pH: 6.5 (ref 5.0–8.0)

## 2018-08-13 LAB — PREGNANCY, URINE: PREG TEST UR: NEGATIVE

## 2018-08-13 MED ORDER — CEFTRIAXONE SODIUM 250 MG IJ SOLR
250.0000 mg | Freq: Once | INTRAMUSCULAR | Status: AC
  Administered 2018-08-13: 250 mg via INTRAMUSCULAR
  Filled 2018-08-13: qty 250

## 2018-08-13 MED ORDER — VALACYCLOVIR HCL 500 MG PO TABS: 500.0000 mg | ORAL_TABLET | Freq: Every day | ORAL | 0 refills | Status: AC

## 2018-08-13 MED ORDER — AZITHROMYCIN 250 MG PO TABS
1000.0000 mg | ORAL_TABLET | Freq: Once | ORAL | Status: AC
  Administered 2018-08-13: 1000 mg via ORAL
  Filled 2018-08-13: qty 4

## 2018-08-13 MED ORDER — VALACYCLOVIR HCL 500 MG PO TABS
1000.0000 mg | ORAL_TABLET | Freq: Once | ORAL | Status: AC
  Administered 2018-08-13: 1000 mg via ORAL
  Filled 2018-08-13: qty 2

## 2018-08-13 NOTE — ED Provider Notes (Signed)
[facility] EMERGENCY DEPARTMENT Provider Note   CSN: [id number] Arrival date & time: 08/13/18  0849     History   Chief Complaint Chief Complaint  Patient presents with  . SEXUALLY TRANSMITTED DISEASE    HPI Susan Cross is a 27 y.o. female.  The history is provided by the patient.  Exposure to STD  This is a recurrent problem. The current episode started yesterday. The problem occurs constantly. The problem has not changed since onset.Pertinent negatives include no chest pain, no abdominal pain, no headaches and no shortness of breath. Nothing aggravates the symptoms. Nothing relieves the symptoms. She has tried nothing for the symptoms. The treatment provided no relief.    Past Medical History:  Diagnosis Date  . Anemia   . Genital herpes   . Genital herpes    last was early in preg  . Infection    UTI    Patient Active Problem List   Diagnosis Date Noted  . 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 Normal intrauterine pregnancy in third trimester 04/14/2017    Past Surgical History:  Procedure Laterality Date  . CESAREAN SECTION N/A 04/14/2017   Procedure: CESAREAN SECTION;  Surgeon: Myna Hidalgo, DO;  Location: [facility];  Service: Obstetrics;  Laterality: N/A;  . NO PAST SURGERIES       OB History    Gravida  3   Para  1   Term  1   Preterm      AB  1   Living  1     SAB  1   TAB      Ectopic      Multiple  0   Live Births  1            Home Medications    Prior to Admission medications   Medication Sig Start Date End Date Taking? Authorizing Provider  ciprofloxacin (CIPRO) 500 MG tablet Take 1 tablet (500 mg total) by mouth 2 (two) times daily. One po bid x 7 days 06/22/18   Fayrene Helper, PA-C  ferrous sulfate 325 (65 FE) MG tablet Take 1 tablet (325 mg total) by mouth daily with breakfast. 04/17/17   Gerald Leitz, MD  ibuprofen (ADVIL,MOTRIN) 600 MG tablet Take 1 tablet (600 mg total) by mouth every 6  (six) hours as needed. 04/17/17   Gerald Leitz, MD  ibuprofen (ADVIL,MOTRIN) 800 MG tablet Take 1 tablet (800 mg total) by mouth 3 (three) times daily. 06/27/18   Arby Barrette, MD  metroNIDAZOLE (FLAGYL) 500 MG tablet Take 1 tablet (500 mg total) by mouth 2 (two) times daily. One po bid x 7 days 06/22/18   Fayrene Helper, PA-C  metroNIDAZOLE (FLAGYL) 500 MG tablet Take 1 tablet (500 mg total) by mouth 2 (two) times daily. One po bid x 7 days 06/27/18   Arby Barrette, MD  ondansetron (ZOFRAN ODT) 4 MG disintegrating tablet Take 1 tablet (4 mg total) by mouth every 4 (four) hours as needed for nausea or vomiting. 06/27/18   Arby Barrette, MD  ondansetron (ZOFRAN ODT) 8 MG disintegrating tablet 8mg  ODT q4 hours prn nausea 09/09/17   Geoffery Lyons, MD  oxyCODONE (OXY IR/ROXICODONE) 5 MG immediate release tablet Take 1 tablet (5 mg total) by mouth every 4 (four) hours as needed (pain scale 4-7). 04/17/17   Gerald Leitz, MD  oxyCODONE-acetaminophen (PERCOCET) 5-325 MG tablet Take 2 tablets by mouth every 6 (six) hours as needed. 06/27/18   Arby Barrette, MD  Prenatal Vit-Min-FA-Fish Oil ([employer] PRENATAL GUMMY PO) Take 2 capsules by mouth daily.     [provider]  promethazine (PHENERGAN) 25 MG tablet Take 1 tablet (25 mg total) by mouth every 6 (six) hours as needed for nausea. 06/22/18   Fayrene Helper, PA-C  valACYclovir (VALTREX) 500 MG tablet Take 1 tablet (500 mg total) by mouth daily. Take 1000 mg for 3 days with outbreaks 08/13/18 09/12/18  Virgina Norfolk, DO    Family History Family History  Problem Relation Age of Onset  . Heart disease Father   . Cancer Maternal Aunt   . Cancer Maternal Grandmother   . Heart disease Paternal Grandmother     Social History Social History   Tobacco Use  . Smoking status: Current Every Day Smoker    Types: Cigars  . Smokeless tobacco: Never Used  Substance Use Topics  . Alcohol use: Yes    Comment: weekly  . Drug use: No     Allergies   Patient  has no known allergies.   Review of Systems Review of Systems  Constitutional: Negative for chills and fever.  HENT: Negative for ear pain and sore throat.   Eyes: Negative for pain and visual disturbance.  Respiratory: Negative for cough and shortness of breath.   Cardiovascular: Negative for chest pain and palpitations.  Gastrointestinal: Negative for abdominal pain and vomiting.  Genitourinary: Positive for dysuria and genital sores. Negative for decreased urine volume, difficulty urinating, dyspareunia, hematuria and vaginal bleeding.  Musculoskeletal: Negative for arthralgias and back pain.  Skin: Negative for color change and rash.  Neurological: Negative for seizures, syncope and headaches.  All other systems reviewed and are negative.    Physical Exam Updated Vital Signs BP 117/70 (BP Location: Right Arm)   Pulse 69   Temp (!) 97.4 F (36.3 C) (Oral)   Resp 16   Ht 5\' 1"  (1.549 m)   Wt 58.1 kg   SpO2 100%   BMI 24.19 kg/m   Physical Exam  Constitutional: She is oriented to person, place, and time. She appears well-developed and well-nourished. No distress.  HENT:  Head: Normocephalic and atraumatic.  Eyes: Pupils are equal, round, and reactive to light. Conjunctivae and EOM are normal.  Neck: Normal range of motion. Neck supple.  Cardiovascular: Normal rate, regular rhythm, normal heart sounds and intact distal pulses.  No murmur heard. Pulmonary/Chest: Effort normal and breath sounds normal. No respiratory distress.  Abdominal: Soft. There is no tenderness.  Genitourinary: No vaginal discharge found.  Genitourinary Comments: Vesicular lesion on labia  Musculoskeletal: Normal range of motion. She exhibits no edema.  Neurological: She is alert and oriented to person, place, and time.  Skin: Skin is warm and dry.  Psychiatric: She has a normal mood and affect.  Nursing note and vitals reviewed.    ED Treatments / Results  Labs (all labs ordered are listed,  but only abnormal results are displayed) Labs Reviewed  URINALYSIS, ROUTINE W REFLEX MICROSCOPIC - Abnormal; Notable for the following components:      Result Value   Hgb urine dipstick TRACE (*)    Leukocytes, UA TRACE (*)    All other components within normal limits  URINALYSIS, MICROSCOPIC (REFLEX) - Abnormal; Notable for the following components:   Bacteria, UA RARE (*)    All other components within normal limits  WET PREP, GENITAL  PREGNANCY, URINE  GC/CHLAMYDIA PROBE AMP ([place]) NOT AT [facility]    EKG None  Radiology No results found.  Procedures Procedures (including critical care time)  Medications Ordered in ED Medications  valACYclovir (VALTREX) tablet 1,000 mg (1,000 mg Oral Given 08/13/18 1014)  cefTRIAXone (ROCEPHIN) injection 250 mg (250 mg Intramuscular Given 08/13/18 1015)  azithromycin (ZITHROMAX) tablet 1,000 mg (1,000 mg Oral Given 08/13/18 1014)     Initial Impression / Assessment and Plan / ED Course  I have reviewed the triage vital signs and the nursing notes.  Pertinent labs & imaging results that were available during my care of the patient were reviewed by me and considered in my medical decision making (see chart for details).     Doristine SectionRaina Lahm is a 27 year old female history of herpes who presents to the ED with herpes outbreak.  Patient with normal vitals.  No fever.  Patient also concerned about STD due to new sex partner.  Empirically treated with Zithromax, Rocephin.  Patient with vesicular lesions on her labia consistent with herpes.  She does not have a current Valtrex prescription.  Given 1000 mg of Valtrex here and given prescription for home.  Patient had no signs of urinary tract infection.  Negative urinalysis.  Negative pregnancy test.  Educated about STDs.  Was discharged from the ED in good condition and told her return to ED if symptoms worsen.  This chart was dictated using voice recognition software.  Despite best efforts to  proofread,  errors can occur which can change the documentation meaning.   Final Clinical Impressions(s) / ED Diagnoses   Final diagnoses:  Genital herpes simplex, unspecified site    ED Discharge Orders         Ordered    valACYclovir (VALTREX) 500 MG tablet  Daily     08/13/18 1039           Claypool Hilluratolo, Madelaine Bhatdam, DO 08/13/18 1048

## 2018-08-13 NOTE — ED Triage Notes (Signed)
Reports seen and treated for gonorrhea.  States she did not finish abx and continues to have discharge and lower abdominal pain.  States she went out of town and cannot find abx,.

## 2018-08-14 LAB — GC/CHLAMYDIA PROBE AMP (~~LOC~~) NOT AT ARMC
Chlamydia: NEGATIVE
Neisseria Gonorrhea: POSITIVE — AB

## 2018-10-08 ENCOUNTER — Other Ambulatory Visit: Payer: Self-pay | Admitting: Obstetrics and Gynecology

## 2018-10-08 DIAGNOSIS — Z3202 Encounter for pregnancy test, result negative: Secondary | ICD-10-CM | POA: Diagnosis not present

## 2018-10-08 DIAGNOSIS — N870 Mild cervical dysplasia: Secondary | ICD-10-CM | POA: Diagnosis not present

## 2018-10-08 DIAGNOSIS — A749 Chlamydial infection, unspecified: Secondary | ICD-10-CM | POA: Diagnosis not present

## 2019-01-03 DIAGNOSIS — L84 Corns and callosities: Secondary | ICD-10-CM | POA: Diagnosis not present

## 2019-01-03 DIAGNOSIS — M2141 Flat foot [pes planus] (acquired), right foot: Secondary | ICD-10-CM | POA: Diagnosis not present

## 2019-01-03 DIAGNOSIS — M216X2 Other acquired deformities of left foot: Secondary | ICD-10-CM | POA: Diagnosis not present

## 2019-01-03 DIAGNOSIS — M2142 Flat foot [pes planus] (acquired), left foot: Secondary | ICD-10-CM | POA: Diagnosis not present

## 2019-01-03 DIAGNOSIS — M79672 Pain in left foot: Secondary | ICD-10-CM | POA: Diagnosis not present

## 2019-01-03 DIAGNOSIS — M79671 Pain in right foot: Secondary | ICD-10-CM | POA: Diagnosis not present

## 2019-01-03 DIAGNOSIS — M216X1 Other acquired deformities of right foot: Secondary | ICD-10-CM | POA: Diagnosis not present

## 2019-04-14 DIAGNOSIS — R102 Pelvic and perineal pain: Secondary | ICD-10-CM | POA: Diagnosis not present

## 2019-04-14 DIAGNOSIS — R300 Dysuria: Secondary | ICD-10-CM | POA: Diagnosis not present

## 2019-04-14 DIAGNOSIS — R1030 Lower abdominal pain, unspecified: Secondary | ICD-10-CM | POA: Diagnosis not present

## 2019-04-14 DIAGNOSIS — Z3202 Encounter for pregnancy test, result negative: Secondary | ICD-10-CM | POA: Diagnosis not present

## 2019-04-14 DIAGNOSIS — R319 Hematuria, unspecified: Secondary | ICD-10-CM | POA: Diagnosis not present

## 2019-04-14 DIAGNOSIS — R197 Diarrhea, unspecified: Secondary | ICD-10-CM | POA: Diagnosis not present

## 2019-04-14 DIAGNOSIS — N898 Other specified noninflammatory disorders of vagina: Secondary | ICD-10-CM | POA: Diagnosis not present

## 2019-04-14 DIAGNOSIS — A549 Gonococcal infection, unspecified: Secondary | ICD-10-CM | POA: Diagnosis not present

## 2019-05-28 DIAGNOSIS — E559 Vitamin D deficiency, unspecified: Secondary | ICD-10-CM | POA: Diagnosis not present

## 2019-05-28 DIAGNOSIS — L659 Nonscarring hair loss, unspecified: Secondary | ICD-10-CM | POA: Diagnosis not present

## 2019-05-28 DIAGNOSIS — R7989 Other specified abnormal findings of blood chemistry: Secondary | ICD-10-CM | POA: Diagnosis not present

## 2019-05-28 DIAGNOSIS — D508 Other iron deficiency anemias: Secondary | ICD-10-CM | POA: Diagnosis not present

## 2019-05-28 DIAGNOSIS — N898 Other specified noninflammatory disorders of vagina: Secondary | ICD-10-CM | POA: Diagnosis not present

## 2019-05-28 DIAGNOSIS — Z113 Encounter for screening for infections with a predominantly sexual mode of transmission: Secondary | ICD-10-CM | POA: Diagnosis not present

## 2019-05-29 DIAGNOSIS — D508 Other iron deficiency anemias: Secondary | ICD-10-CM | POA: Diagnosis not present

## 2019-07-05 DIAGNOSIS — D508 Other iron deficiency anemias: Secondary | ICD-10-CM | POA: Diagnosis not present

## 2019-07-05 DIAGNOSIS — Z13220 Encounter for screening for lipoid disorders: Secondary | ICD-10-CM | POA: Diagnosis not present

## 2019-07-05 DIAGNOSIS — F330 Major depressive disorder, recurrent, mild: Secondary | ICD-10-CM | POA: Diagnosis not present

## 2019-07-05 DIAGNOSIS — R197 Diarrhea, unspecified: Secondary | ICD-10-CM | POA: Diagnosis not present

## 2019-07-05 DIAGNOSIS — Z23 Encounter for immunization: Secondary | ICD-10-CM | POA: Diagnosis not present

## 2019-07-05 DIAGNOSIS — Z131 Encounter for screening for diabetes mellitus: Secondary | ICD-10-CM | POA: Diagnosis not present

## 2019-07-05 DIAGNOSIS — E559 Vitamin D deficiency, unspecified: Secondary | ICD-10-CM | POA: Diagnosis not present

## 2019-07-05 DIAGNOSIS — Z0000 Encounter for general adult medical examination without abnormal findings: Secondary | ICD-10-CM | POA: Diagnosis not present

## 2019-07-11 DIAGNOSIS — R197 Diarrhea, unspecified: Secondary | ICD-10-CM | POA: Diagnosis not present

## 2020-02-13 IMAGING — CT CT ABD-PELV W/ CM
2 of 4 series · 15 of 46 positions shown, 17 images · IV contrast (APPLIED)
Comparison: None.

CLINICAL DATA: RIGHT-sided abdominal pain for 12 hours, sharp pain.

EXAM:
CT ABDOMEN AND PELVIS WITH CONTRAST
TECHNIQUE: Multidetector CT imaging of the abdomen and pelvis was performed
using the standard protocol following bolus administration of
intravenous contrast.
CONTRAST:  100mL QDCIS9-DSS IOPAMIDOL (QDCIS9-DSS) INJECTION 61%

[Series 2: axial st · axial · 0.63mm/px · z∈[+326,+736]mm · 12 of 90 slices shown, 14 images]
[im 4/90  soft-tissue]
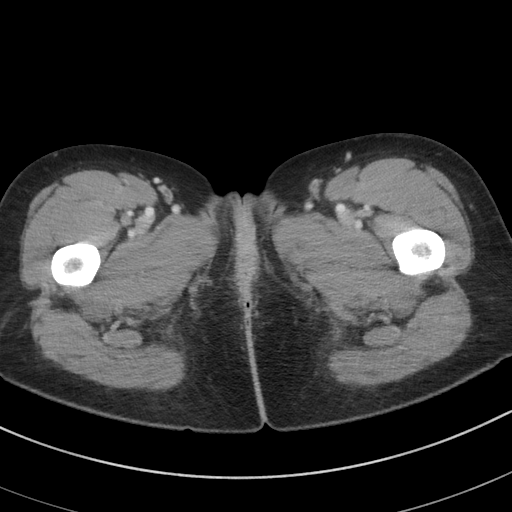
[im 4/90  bone]
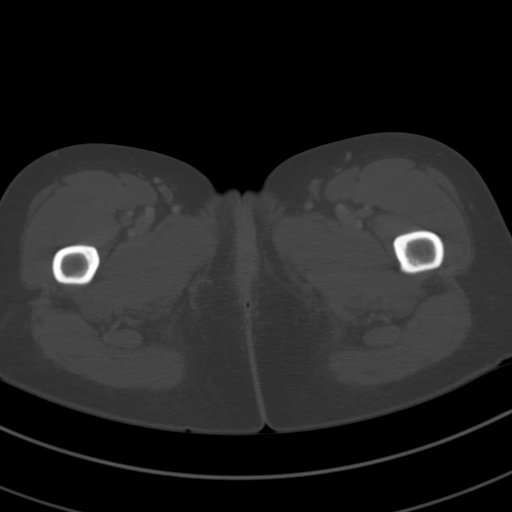
[im 12/90  soft-tissue]
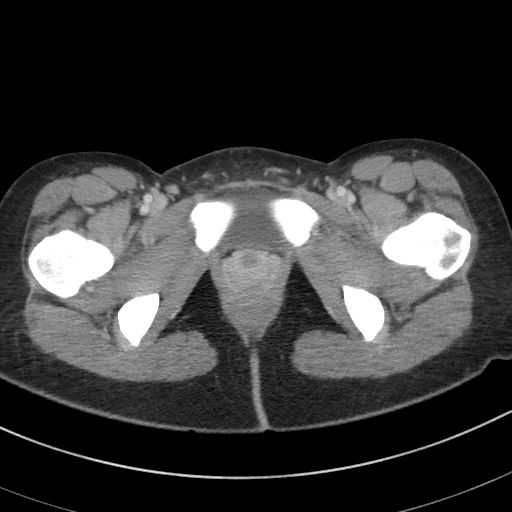
[im 19/90  soft-tissue]
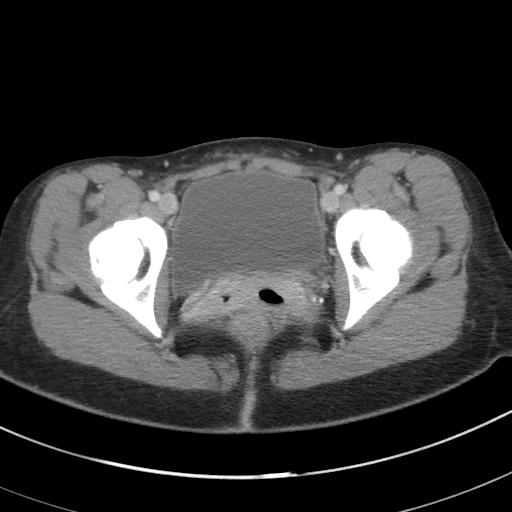
[im 26/90  soft-tissue]
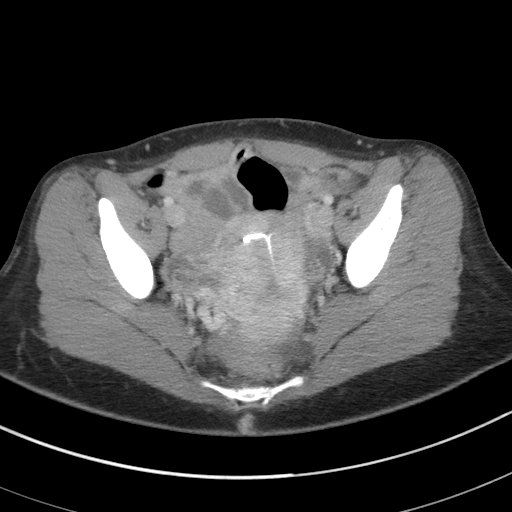
[im 34/90  soft-tissue]
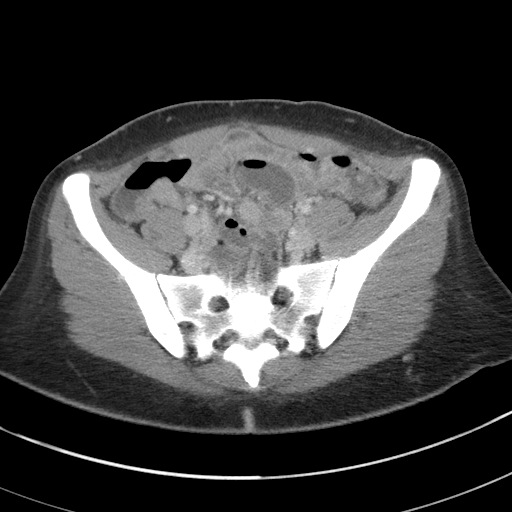
[im 41/90  soft-tissue]
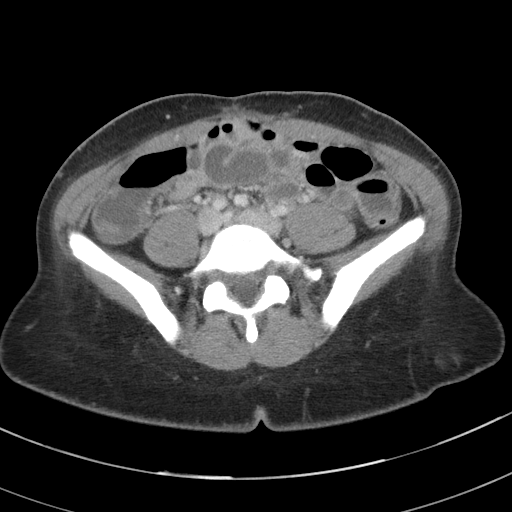
[im 49/90  soft-tissue]
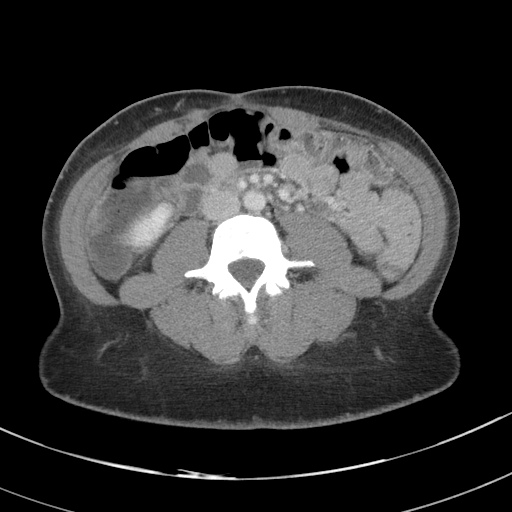
[im 56/90  soft-tissue]
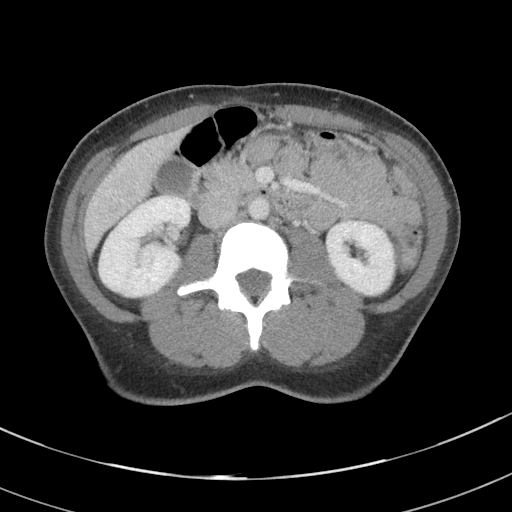
[im 64/90  soft-tissue]
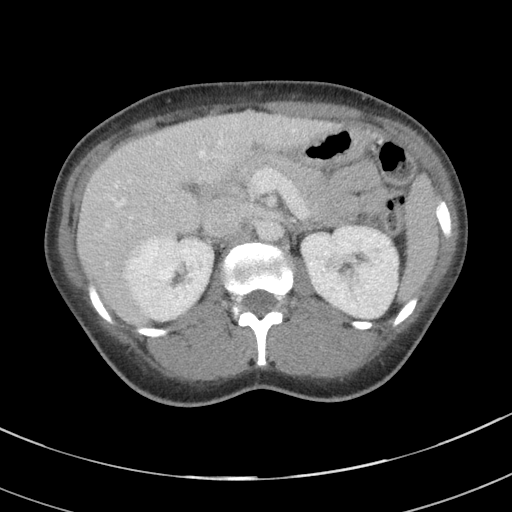
[im 64/90  bone]
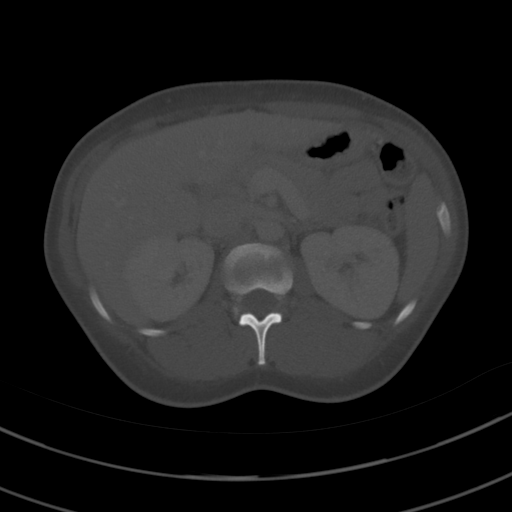
[im 71/90  soft-tissue]
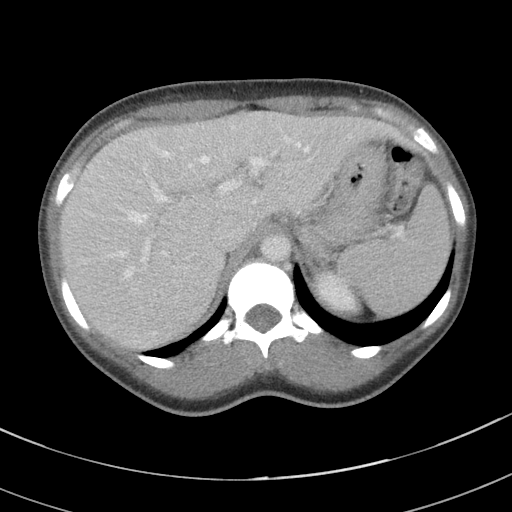
[im 78/90  soft-tissue]
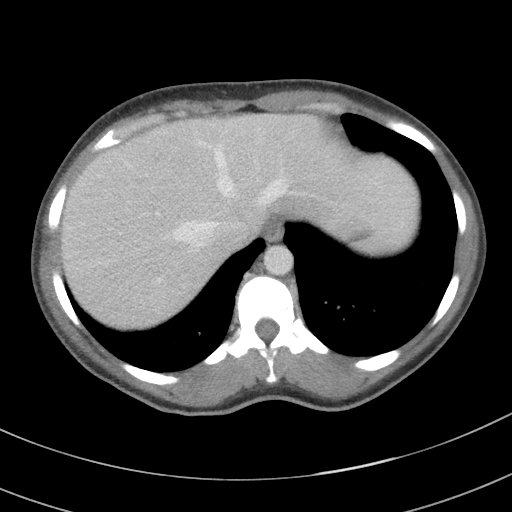
[im 86/90  soft-tissue]
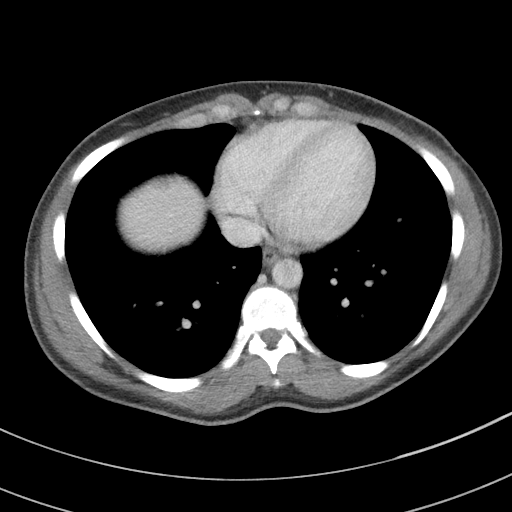

[Series 5: coronal st · coronal · 0.66mm/px · 3 of 72 slices shown]
[im 24/72  soft-tissue]
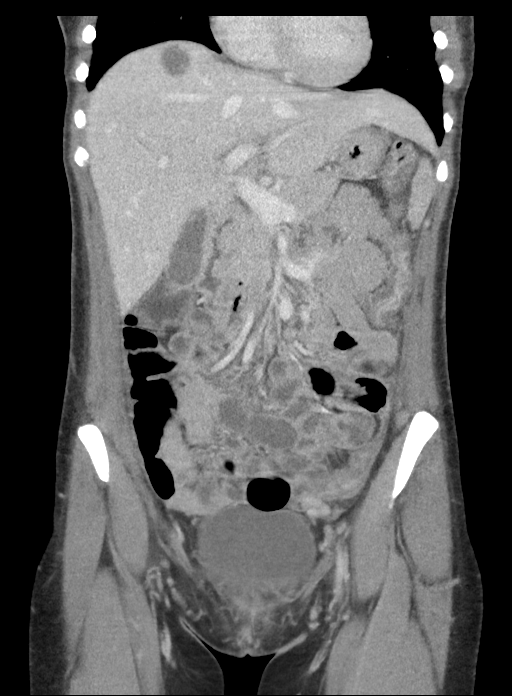
[im 32/72  soft-tissue]
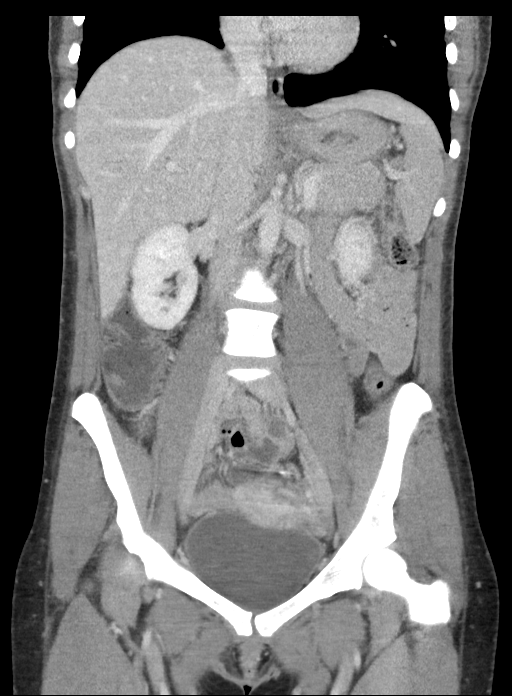
[im 40/72  soft-tissue]
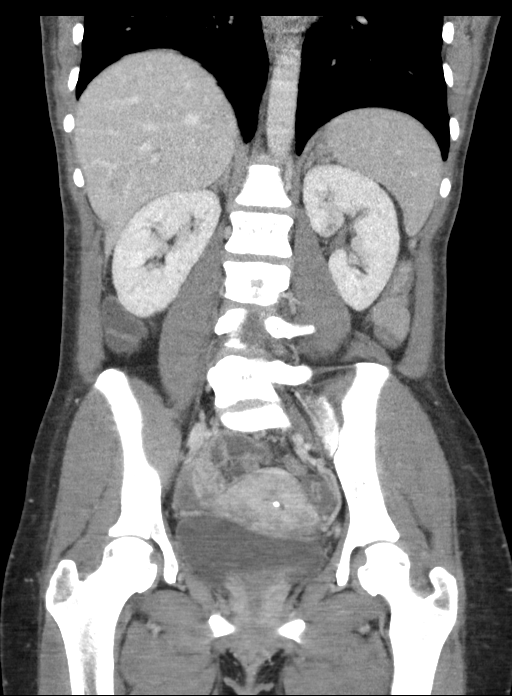

[15 of 46 positions shown; findings below may reference images not displayed]

FINDINGS: Lower chest: No acute abnormality.

Hepatobiliary: Scattered hypodense masslike lesions within the
liver, largest at the dome of the RIGHT liver lobe measuring 2.3 cm,
some difficult to characterize due to small size and phase of
contrast enhancement, suspected hemangiomas and/or cysts.

Gallbladder appears normal.  No bile duct dilatation seen.

Pancreas: Unremarkable. No pancreatic ductal dilatation or
surrounding inflammatory changes.

Spleen: Normal in size without focal abnormality.

Adrenals/Urinary Tract: Adrenal glands appear normal. Kidneys appear
normal without mass, stone or hydronephrosis. No ureteral or bladder
calculi identified. Bladder appears normal.

Stomach/Bowel: No dilated large or small bowel loops. No convincing
evidence of bowel wall inflammation. Fluid is present throughout a
significant portion of the small and large bowel which can be a
secondary sign of gastroenteritis or enterocolitis.

Appendix is not confidently seen due to multiple overlapping
non-opacified small and large bowel loops in the RIGHT lower
quadrant. However, there is no free fluid or obvious inflammatory
change appreciated in the RIGHT lower quadrant.

Vascular/Lymphatic: No significant vascular findings are present. No
enlarged abdominal or pelvic lymph nodes.

Reproductive: Intrauterine device appears appropriately positioned
within the uterus. No mass or free fluid appreciated within either
adnexal region.

Other: No free fluid or abscess collection identified. No free
intraperitoneal air.

Musculoskeletal: No acute or suspicious osseous finding.
IMPRESSION: 1. No convincing source for RIGHT-sided abdominal pain identified.
No renal or ureteral calculi. No bowel obstruction or evidence of
bowel wall inflammation. There is fluid throughout a significant
portion of the small and large bowel which can be a secondary sign
of gastroenteritis or enterocolitis.
2. Appendix is not seen, therefore, an early acute appendicitis
cannot be confidently excluded. However, there is no free fluid,
pericecal inflammation or other secondary signs of appendicitis. If
any clinical concern for early acute appendicitis, would recommend
close clinical follow-up and could consider follow-up CT with oral
contrast.
3. Multiple hypodense masslike lesions within the liver, of
uncertain etiology but suspected to represent a combination of
benign hemangiomas and cysts. Patient has a known benign hemangioma
within the RIGHT liver lobe, per previous ultrasound report.

## 2020-07-06 ENCOUNTER — Encounter (HOSPITAL_COMMUNITY): Payer: Self-pay | Admitting: Family Medicine

## 2020-07-06 ENCOUNTER — Ambulatory Visit (HOSPITAL_COMMUNITY)
Admission: EM | Admit: 2020-07-06 | Discharge: 2020-07-06 | Disposition: A | Discharge status: Home / Self Care | Payer: Managed Care, Other (non HMO) | Attending: Family Medicine | Admitting: Family Medicine

## 2020-07-06 ENCOUNTER — Other Ambulatory Visit: Payer: Self-pay

## 2020-07-06 DIAGNOSIS — B370 Candidal stomatitis: Secondary | ICD-10-CM | POA: Insufficient documentation

## 2020-07-06 DIAGNOSIS — R059 Cough, unspecified: Secondary | ICD-10-CM | POA: Diagnosis not present

## 2020-07-06 DIAGNOSIS — R079 Chest pain, unspecified: Secondary | ICD-10-CM | POA: Diagnosis not present

## 2020-07-06 DIAGNOSIS — J029 Acute pharyngitis, unspecified: Secondary | ICD-10-CM | POA: Diagnosis not present

## 2020-07-06 DIAGNOSIS — T50Z95A Adverse effect of other vaccines and biological substances, initial encounter: Secondary | ICD-10-CM | POA: Diagnosis present

## 2020-07-06 DIAGNOSIS — Z20822 Contact with and (suspected) exposure to covid-19: Secondary | ICD-10-CM | POA: Diagnosis present

## 2020-07-06 LAB — SARS CORONAVIRUS 2 (TAT 6-24 HRS): SARS Coronavirus 2: NEGATIVE

## 2020-07-06 LAB — POCT RAPID STREP A, ED / UC: Streptococcus, Group A Screen (Direct): NEGATIVE

## 2020-07-06 MED ORDER — LIDOCAINE VISCOUS HCL 2 % MT SOLN: 15.0000 mL | OROMUCOSAL | 0 refills | Status: DC | PRN

## 2020-07-06 MED ORDER — NYSTATIN 100000 UNIT/ML MT SUSP: 500000.0000 [IU] | Freq: Four times a day (QID) | OROMUCOSAL | 0 refills | Status: DC

## 2020-07-06 MED ORDER — HYDROXYZINE HCL 25 MG PO TABS: 25.0000 mg | ORAL_TABLET | Freq: Four times a day (QID) | ORAL | 0 refills | Status: DC

## 2020-07-06 MED ORDER — METHYLPREDNISOLONE SODIUM SUCC 125 MG IJ SOLR
INTRAMUSCULAR | Status: AC
  Filled 2020-07-06: qty 2

## 2020-07-06 MED ORDER — METHYLPREDNISOLONE SODIUM SUCC 125 MG IJ SOLR
80.0000 mg | Freq: Once | INTRAMUSCULAR | Status: AC
  Administered 2020-07-06: 80 mg via INTRAMUSCULAR

## 2020-07-06 NOTE — ED Provider Notes (Signed)
[facility]    CSN: [id number] Arrival date & time: 07/06/20  3976      History   Chief Complaint Chief Complaint  Patient presents with  . Urticaria  . Cough  . Chest Pain  . Sore Throat    HPI Susan Cross is a 29 y.o. female.   Patient is a 29 year old female that presents today for possible reaction to Covid and flu vaccine.  Reporting she received both vaccines at the same time on this past Thursday.  Friday morning started developing cough, sore throat, shortness of breath, headache and body aches.  The symptoms continued on throughout the weekend.  Woke up this morning with generalized hives, itching.  Took 25 mg of Benadryl without any relief.  Also has some night sweats.  Pain with swallowing.      Past Medical History:  Diagnosis Date  . Anemia   . Genital herpes   . Genital herpes    last was early in preg  . Infection    UTI    Patient Active Problem List   Diagnosis Date Noted  . 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 Normal intrauterine pregnancy in third trimester 04/14/2017    Past Surgical History:  Procedure Laterality Date  . CESAREAN SECTION N/A 04/14/2017   Procedure: CESAREAN SECTION;  Surgeon: Myna Hidalgo, DO;  Location: [facility];  Service: Obstetrics;  Laterality: N/A;  . NO PAST SURGERIES      OB History    Gravida  3   Para  1   Term  1   Preterm      AB  1   Living  1     SAB  1   TAB      Ectopic      Multiple  0   Live Births  1            Home Medications    Prior to Admission medications   Medication Sig Start Date End Date Taking? Authorizing Provider  sertraline (ZOLOFT) 100 MG tablet Take 100 mg by mouth daily. 03/05/20  Yes [provider]  valACYclovir (VALTREX) 1000 MG tablet Take 1,000 mg by mouth daily. 06/24/20  Yes [provider]  hydrOXYzine (ATARAX/VISTARIL) 25 MG tablet Take 1 tablet (25 mg total) by mouth every 6 (six) hours.  07/06/20   Daun Rens, Gloris Manchester A, NP  lidocaine (XYLOCAINE) 2 % solution Use as directed 15 mLs in the mouth or throat as needed for mouth pain. 07/06/20   Dahlia Byes A, NP  nystatin (MYCOSTATIN) 100000 UNIT/ML suspension Take 5 mLs (500,000 Units total) by mouth 4 (four) times daily. 07/06/20   Dahlia Byes A, NP  ferrous sulfate 325 (65 FE) MG tablet Take 1 tablet (325 mg total) by mouth daily with breakfast. 04/17/17 07/06/20  Gerald Leitz, MD  promethazine (PHENERGAN) 25 MG tablet Take 1 tablet (25 mg total) by mouth every 6 (six) hours as needed for nausea. 06/22/18 07/06/20  Fayrene Helper, PA-C    Family History Family History  Problem Relation Age of Onset  . Heart disease Father   . Cancer Maternal Aunt   . Cancer Maternal Grandmother   . Heart disease Paternal Grandmother     Social History Social History   Tobacco Use  . Smoking status: Former Smoker    Types: Cigars  . Smokeless tobacco: Never Used  Vaping Use  . Vaping Use: Never used  Substance Use Topics  . Alcohol use:  Not Currently    Comment: weekly  . Drug use: No     Allergies   Patient has no known allergies.   Review of Systems Review of Systems   Physical Exam Triage Vital Signs ED Triage Vitals  Enc Vitals Group     BP 07/06/20 0846 (!) 144/84     Pulse Rate 07/06/20 0846 92     Resp 07/06/20 0846 16     Temp 07/06/20 0846 98.5 F (36.9 C)     Temp Source 07/06/20 0846 Oral     SpO2 07/06/20 0846 99 %     Weight --      Height --      Head Circumference --      Peak Flow --      Pain Score 07/06/20 0848 7     Pain Loc --      Pain Edu? --      Excl. in GC? --    No data found.  Updated Vital Signs BP (!) 144/84 (BP Location: Left Arm)   Pulse 92   Temp 98.5 F (36.9 C) (Oral)   Resp 16   SpO2 99%   Visual Acuity Right Eye Distance:   Left Eye Distance:   Bilateral Distance:    Right Eye Near:   Left Eye Near:    Bilateral Near:     Physical Exam Vitals and nursing note reviewed.   Constitutional:      General: She is not in acute distress.    Appearance: Normal appearance. She is not ill-appearing, toxic-appearing or diaphoretic.  HENT:     Head: Normocephalic.     Nose: Nose normal.     Mouth/Throat:     Pharynx: Oropharynx is clear. Posterior oropharyngeal erythema present.     Comments: White/yellow coating on the tongue.  Eyes:     Conjunctiva/sclera: Conjunctivae normal.  Cardiovascular:     Rate and Rhythm: Normal rate and regular rhythm.  Pulmonary:     Effort: Pulmonary effort is normal.     Breath sounds: Normal breath sounds.  Musculoskeletal:        General: Normal range of motion.     Cervical back: Normal range of motion.  Skin:    General: Skin is warm and dry.     Findings: No rash.  Neurological:     Mental Status: She is alert.  Psychiatric:        Mood and Affect: Mood normal.      UC Treatments / Results  Labs (all labs ordered are listed, but only abnormal results are displayed) Labs Reviewed  SARS CORONAVIRUS 2 (TAT 6-24 HRS)  CULTURE, GROUP A STREP [facility])  POCT RAPID STREP A, ED / UC    EKG   Radiology No results found.  Procedures Procedures (including critical care time)  Medications Ordered in UC Medications  methylPREDNISolone sodium succinate (SOLU-MEDROL) 125 mg/2 mL injection 80 mg (80 mg Intramuscular Given 07/06/20 0922)    Initial Impression / Assessment and Plan / UC Course  I have reviewed the triage vital signs and the nursing notes.  Pertinent labs & imaging results that were available during my care of the patient were reviewed by me and considered in my medical decision making (see chart for details).     Vaccine reaction to include thrush Believe patient may be having reaction to receiving both the flu and Covid vaccine approximate 4 days ago. Will treat thrush with nystatin and vicious lidocaine for pain  Instructions given on how to use. Steroid injection given here for generalized hives,  itching Hydroxyzine for itching as needed. Rapid strep test negative Follow up as needed for continued or worsening symptoms  Final Clinical Impressions(s) / UC Diagnoses   Final diagnoses:  Thrush  Adverse effect of vaccine, initial encounter     Discharge Instructions     Hydroxyzine for itching. This medicine may make you drowsy.  Mix 36ml of the lidocaine and 5 ml of the nystatin and swish, gargle and spit 4 times a day.       ED Prescriptions    Medication Sig Dispense Auth. Provider   hydrOXYzine (ATARAX/VISTARIL) 25 MG tablet Take 1 tablet (25 mg total) by mouth every 6 (six) hours. 12 tablet Lanisa Ishler A, NP   nystatin (MYCOSTATIN) 100000 UNIT/ML suspension Take 5 mLs (500,000 Units total) by mouth 4 (four) times daily. 60 mL Takai Chiaramonte A, NP   lidocaine (XYLOCAINE) 2 % solution Use as directed 15 mLs in the mouth or throat as needed for mouth pain. 100 mL Dahlia Byes A, NP     PDMP not reviewed this encounter.   Janace Aris, NP 07/06/20 [phone]

## 2020-07-06 NOTE — ED Triage Notes (Signed)
Patient in with complaints of generalized hives that started last night. Flu and covid shots received at the same time on Thursday.Friday morning patient states she developed a cough, sore throat, SOB, headache, and chest heaviness in the middle of chest on Friday.  Benadryl 25 mg taken this morning with no relief. Patient states she woke up in sweats.  Patient states that she also has a sore throat and it is hard to swallow.

## 2020-07-06 NOTE — Discharge Instructions (Addendum)
Hydroxyzine for itching. This medicine may make you drowsy.  Mix 26ml of the lidocaine and 5 ml of the nystatin and swish, gargle and spit 4 times a day.

## 2020-07-08 LAB — CULTURE, GROUP A STREP (THRC)

## 2020-07-19 ENCOUNTER — Encounter (HOSPITAL_BASED_OUTPATIENT_CLINIC_OR_DEPARTMENT_OTHER): Payer: Self-pay | Admitting: Emergency Medicine

## 2020-07-19 ENCOUNTER — Emergency Department (HOSPITAL_BASED_OUTPATIENT_CLINIC_OR_DEPARTMENT_OTHER)
Admission: EM | Admit: 2020-07-19 | Discharge: 2020-07-19 | Disposition: A | Discharge status: Home / Self Care | Payer: Managed Care, Other (non HMO) | Attending: Emergency Medicine | Admitting: Emergency Medicine

## 2020-07-19 ENCOUNTER — Other Ambulatory Visit: Payer: Self-pay

## 2020-07-19 DIAGNOSIS — J040 Acute laryngitis: Secondary | ICD-10-CM | POA: Diagnosis not present

## 2020-07-19 DIAGNOSIS — R070 Pain in throat: Secondary | ICD-10-CM | POA: Diagnosis present

## 2020-07-19 DIAGNOSIS — Z87891 Personal history of nicotine dependence: Secondary | ICD-10-CM | POA: Insufficient documentation

## 2020-07-19 MED ORDER — BENZONATATE 100 MG PO CAPS: 100.0000 mg | ORAL_CAPSULE | Freq: Three times a day (TID) | ORAL | 0 refills | Status: DC

## 2020-07-19 MED ORDER — BENZONATATE 100 MG PO CAPS
100.0000 mg | ORAL_CAPSULE | Freq: Once | ORAL | Status: AC
  Administered 2020-07-19: 100 mg via ORAL
  Filled 2020-07-19: qty 1

## 2020-07-19 NOTE — Discharge Instructions (Signed)
You may take over-the-counter medicine for symptomatic relief, such as Tylenol, Motrin, TheraFlu, Alka seltzer , black elderberry, etc. Please limit acetaminophen (Tylenol) to 4000 mg and Ibuprofen (Motrin, Advil, etc.) to 2400 mg for a 24hr period. Please note that other over-the-counter medicine may contain acetaminophen or ibuprofen as a component of their ingredients.   

## 2020-07-19 NOTE — ED Notes (Signed)
Discharge instructions and prescriptions discussed with patient. Verbalized understanding. Departs ED at this time in stable condition.  ° °

## 2020-07-19 NOTE — ED Triage Notes (Signed)
C/o sore throat and hoarse voice that started yesterday. She was recently seen for a similar complaint but states it had resolved completely. Also reports nasal congestion and "feeling of mucous" in her chest. No fever, SHOB, or other sx.

## 2020-07-19 NOTE — ED Provider Notes (Signed)
[facility] EMERGENCY DEPARTMENT Provider Note  CSN: [id number] Arrival date & time: 07/19/20 9622  Chief Complaint(s) Sore Throat  HPI Susan Cross is a 29 y.o. female   The history is provided by the patient.  Sore Throat This is a new problem. The current episode started 2 days ago. The problem occurs constantly. The problem has not changed since onset.Pertinent negatives include no chest pain, no abdominal pain, no headaches and no shortness of breath. Nothing aggravates the symptoms. Nothing relieves the symptoms. Treatments tried: OTC meds.   +sick contact at home (daughter).  Past Medical History Past Medical History:  Diagnosis Date  . Anemia   . Genital herpes   . Genital herpes    last was early in preg  . Infection    UTI   Patient Active Problem List   Diagnosis Date Noted  . 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 Normal intrauterine pregnancy in third trimester 04/14/2017   Home Medication(s) Prior to Admission medications   Medication Sig Start Date End Date Taking? Authorizing Provider  benzonatate (TESSALON) 100 MG capsule Take 1 capsule (100 mg total) by mouth every 8 (eight) hours. 07/19/20   Nira Conn, MD  hydrOXYzine (ATARAX/VISTARIL) 25 MG tablet Take 1 tablet (25 mg total) by mouth every 6 (six) hours. 07/06/20   Bast, Gloris Manchester A, NP  lidocaine (XYLOCAINE) 2 % solution Use as directed 15 mLs in the mouth or throat as needed for mouth pain. 07/06/20   Dahlia Byes A, NP  nystatin (MYCOSTATIN) 100000 UNIT/ML suspension Take 5 mLs (500,000 Units total) by mouth 4 (four) times daily. 07/06/20   Dahlia Byes A, NP  sertraline (ZOLOFT) 100 MG tablet Take 100 mg by mouth daily. 03/05/20   [provider]  valACYclovir (VALTREX) 1000 MG tablet Take 1,000 mg by mouth daily. 06/24/20   [provider]  ferrous sulfate 325 (65 FE) MG tablet Take 1 tablet (325 mg total) by mouth daily with breakfast. 04/17/17 07/06/20   Gerald Leitz, MD  promethazine (PHENERGAN) 25 MG tablet Take 1 tablet (25 mg total) by mouth every 6 (six) hours as needed for nausea. 06/22/18 07/06/20  Fayrene Helper, PA-C                                                                                                                                    Past Surgical History Past Surgical History:  Procedure Laterality Date  . CESAREAN SECTION N/A 04/14/2017   Procedure: CESAREAN SECTION;  Surgeon: Myna Hidalgo, DO;  Location: [facility];  Service: Obstetrics;  Laterality: N/A;  . NO PAST SURGERIES     Family History Family History  Problem Relation Age of Onset  . Heart disease Father   . Cancer Maternal Aunt   . Cancer Maternal Grandmother   . Heart disease Paternal Grandmother     Social History Social History   Tobacco Use  .  Smoking status: Former Smoker    Types: Cigars  . Smokeless tobacco: Never Used  Vaping Use  . Vaping Use: Never used  Substance Use Topics  . Alcohol use: Not Currently    Comment: weekly  . Drug use: No   Allergies Patient has no known allergies.  Review of Systems Review of Systems  Respiratory: Negative for shortness of breath.   Cardiovascular: Negative for chest pain.  Gastrointestinal: Negative for abdominal pain.  Neurological: Negative for headaches.   All other systems are reviewed and are negative for acute change except as noted in the HPI  Physical Exam Vital Signs  I have reviewed the triage vital signs BP (!) 117/94 (BP Location: Right Arm)   Pulse 87   Temp 98.6 F (37 C) (Oral)   Resp 20   Ht 5\' 2"  (1.575 m)   Wt 73.4 kg   SpO2 99%   BMI 29.61 kg/m   Physical Exam Vitals reviewed.  Constitutional:      General: She is not in acute distress.    Appearance: She is well-developed. She is not diaphoretic.  HENT:     Head: Normocephalic and atraumatic.     Nose: Congestion and rhinorrhea present.     Mouth/Throat:     Pharynx: No oropharyngeal exudate.      Tonsils: No tonsillar exudate.     Comments: Post nasal drip with cobblestoning Hoarse voice Eyes:     General: No scleral icterus.       Right eye: No discharge.        Left eye: No discharge.     Conjunctiva/sclera: Conjunctivae normal.     Pupils: Pupils are equal, round, and reactive to light.  Cardiovascular:     Rate and Rhythm: Normal rate and regular rhythm.     Heart sounds: No murmur heard.  No friction rub. No gallop.   Pulmonary:     Effort: Pulmonary effort is normal. No respiratory distress.     Breath sounds: Normal breath sounds. No stridor. No rales.  Abdominal:     General: There is no distension.     Palpations: Abdomen is soft.     Tenderness: There is no abdominal tenderness.  Musculoskeletal:        General: No tenderness.     Cervical back: Normal range of motion and neck supple.  Skin:    General: Skin is warm and dry.     Findings: No erythema or rash.  Neurological:     Mental Status: She is alert and oriented to person, place, and time.     ED Results and Treatments Labs (all labs ordered are listed, but only abnormal results are displayed) Labs Reviewed - No data to display                                                                                                                       EKG  EKG Interpretation  Date/Time:    Ventricular Rate:  PR Interval:    QRS Duration:   QT Interval:    QTC Calculation:   R Axis:     Text Interpretation:        Radiology No results found.  Pertinent labs & imaging results that were available during my care of the patient were reviewed by me and considered in my medical decision making (see chart for details).  Medications Ordered in ED Medications  benzonatate (TESSALON) capsule 100 mg (has no administration in time range)                                                                                                                                    Procedures Procedures  (including  critical care time)  Medical Decision Making / ED Course I have reviewed the nursing notes for this encounter and the patient's prior records (if available in EHR or on provided paperwork).   Susan Cross was evaluated in Emergency Department on 07/19/2020 for the symptoms described in the history of present illness. She was evaluated in the context of the global COVID-19 pandemic, which necessitated consideration that the patient might be at risk for infection with the SARS-CoV-2 virus that causes COVID-19. Institutional protocols and algorithms that pertain to the evaluation of patients at risk for COVID-19 are in a state of rapid change based on information released by regulatory bodies including the CDC and federal and state organizations. These policies and algorithms were followed during the patient's care in the ED.  Patient presents with viral symptoms for 2 days. adequate oral hydration. Rest of history as above.  Patient appears well. No signs of toxicity, patient is interactive. No hypoxia, tachypnea or other signs of respiratory distress. No sign of clinical dehydration. Lung exam clear. Rest of exam as above.  Most consistent with viral illness   No evidence suggestive of pharyngitis, AOM, PNA, or meningitis.  Chest x-ray not indicated at this time.  Discussed symptomatic treatment with the patient and they will follow closely with their PCP.        Final Clinical Impression(s) / ED Diagnoses Final diagnoses:  Acute laryngitis   The patient appears reasonably screened and/or stabilized for discharge and I doubt any other medical condition or other [facility] requiring further screening, evaluation, or treatment in the ED at this time prior to discharge. Safe for discharge with strict return precautions.  Disposition: Discharge  Condition: Good  I have discussed the results, Dx and Tx plan with the patient/family who expressed understanding and agree(s) with the plan. Discharge  instructions discussed at length. The patient/family was given strict return precautions who verbalized understanding of the instructions. No further questions at time of discharge.    ED Discharge Orders         Ordered    benzonatate (TESSALON) 100 MG capsule  Every 8 hours        07/19/20 0602  Follow Up: Primary care provider  Schedule an appointment as soon as possible for a visit  in 5-7 days, If symptoms do not improve or  worsen      This chart was dictated using voice recognition software.  Despite best efforts to proofread,  errors can occur which can change the documentation meaning.   Nira Connardama, Geoffery Aultman Eduardo, MD 07/19/20 [phone]

## 2020-09-22 ENCOUNTER — Encounter (HOSPITAL_BASED_OUTPATIENT_CLINIC_OR_DEPARTMENT_OTHER): Payer: Self-pay | Admitting: *Deleted

## 2020-09-22 ENCOUNTER — Other Ambulatory Visit: Payer: Self-pay

## 2020-09-22 ENCOUNTER — Emergency Department (HOSPITAL_BASED_OUTPATIENT_CLINIC_OR_DEPARTMENT_OTHER)
Admission: EM | Admit: 2020-09-22 | Discharge: 2020-09-22 | Disposition: A | Discharge status: Home / Self Care | Payer: 59 | Attending: Emergency Medicine | Admitting: Emergency Medicine

## 2020-09-22 DIAGNOSIS — R519 Headache, unspecified: Secondary | ICD-10-CM | POA: Diagnosis not present

## 2020-09-22 DIAGNOSIS — M791 Myalgia, unspecified site: Secondary | ICD-10-CM | POA: Diagnosis not present

## 2020-09-22 DIAGNOSIS — Z5321 Procedure and treatment not carried out due to patient leaving prior to being seen by health care provider: Secondary | ICD-10-CM | POA: Insufficient documentation

## 2020-09-22 DIAGNOSIS — R5383 Other fatigue: Secondary | ICD-10-CM | POA: Diagnosis not present

## 2020-09-22 DIAGNOSIS — R509 Fever, unspecified: Secondary | ICD-10-CM | POA: Insufficient documentation

## 2020-09-22 DIAGNOSIS — R059 Cough, unspecified: Secondary | ICD-10-CM | POA: Diagnosis not present

## 2020-09-22 NOTE — ED Triage Notes (Signed)
Cough, headache, body aches, chills, fever and fatigue x 2 days. Covid exposure.

## 2021-01-08 ENCOUNTER — Encounter (HOSPITAL_BASED_OUTPATIENT_CLINIC_OR_DEPARTMENT_OTHER): Payer: Self-pay | Admitting: Emergency Medicine

## 2021-01-08 ENCOUNTER — Other Ambulatory Visit: Payer: Self-pay

## 2021-01-08 ENCOUNTER — Emergency Department (HOSPITAL_BASED_OUTPATIENT_CLINIC_OR_DEPARTMENT_OTHER)
Admission: EM | Admit: 2021-01-08 | Discharge: 2021-01-08 | Disposition: A | Discharge status: Home / Self Care | Payer: 59 | Attending: Emergency Medicine | Admitting: Emergency Medicine

## 2021-01-08 DIAGNOSIS — R1013 Epigastric pain: Secondary | ICD-10-CM | POA: Insufficient documentation

## 2021-01-08 DIAGNOSIS — Z87891 Personal history of nicotine dependence: Secondary | ICD-10-CM | POA: Diagnosis not present

## 2021-01-08 DIAGNOSIS — R112 Nausea with vomiting, unspecified: Secondary | ICD-10-CM | POA: Diagnosis present

## 2021-01-08 DIAGNOSIS — N3000 Acute cystitis without hematuria: Secondary | ICD-10-CM

## 2021-01-08 LAB — CBC WITH DIFFERENTIAL/PLATELET
Abs Immature Granulocytes: 0.02 10*3/uL (ref 0.00–0.07)
Basophils Absolute: 0 10*3/uL (ref 0.0–0.1)
Basophils Relative: 1 %
Eosinophils Absolute: 0.1 10*3/uL (ref 0.0–0.5)
Eosinophils Relative: 2 %
HCT: 44.1 % (ref 36.0–46.0)
Hemoglobin: 14.8 g/dL (ref 12.0–15.0)
Immature Granulocytes: 0 %
Lymphocytes Relative: 14 %
Lymphs Abs: 1 10*3/uL (ref 0.7–4.0)
MCH: 29.5 pg (ref 26.0–34.0)
MCHC: 33.6 g/dL (ref 30.0–36.0)
MCV: 88 fL (ref 80.0–100.0)
Monocytes Absolute: 0.5 10*3/uL (ref 0.1–1.0)
Monocytes Relative: 7 %
Neutro Abs: 5.6 10*3/uL (ref 1.7–7.7)
Neutrophils Relative %: 76 %
Platelets: 254 10*3/uL (ref 150–400)
RBC: 5.01 MIL/uL (ref 3.87–5.11)
RDW: 16.1 % — ABNORMAL HIGH (ref 11.5–15.5)
WBC: 7.3 10*3/uL (ref 4.0–10.5)
nRBC: 0 % (ref 0.0–0.2)

## 2021-01-08 LAB — URINALYSIS, ROUTINE W REFLEX MICROSCOPIC
Glucose, UA: NEGATIVE mg/dL
Ketones, ur: >80 mg/dL — AB
Nitrite: NEGATIVE
Protein, ur: 30 mg/dL — AB
Specific Gravity, Urine: 1.025 (ref 1.005–1.030)
pH: 6.5 (ref 5.0–8.0)

## 2021-01-08 LAB — LIPASE, BLOOD: Lipase: 40 U/L (ref 11–51)

## 2021-01-08 LAB — COMPREHENSIVE METABOLIC PANEL
ALT: 44 U/L (ref 0–44)
AST: 42 U/L — ABNORMAL HIGH (ref 15–41)
Albumin: 4.5 g/dL (ref 3.5–5.0)
Alkaline Phosphatase: 60 U/L (ref 38–126)
Anion gap: 12 (ref 5–15)
BUN: 14 mg/dL (ref 6–20)
CO2: 22 mmol/L (ref 22–32)
Calcium: 9.3 mg/dL (ref 8.9–10.3)
Chloride: 103 mmol/L (ref 98–111)
Creatinine, Ser: 0.55 mg/dL (ref 0.44–1.00)
GFR, Estimated: >60 mL/min (ref >60)
Glucose, Bld: 87 mg/dL (ref 70–99)
Potassium: 3.5 mmol/L (ref 3.5–5.1)
Sodium: 137 mmol/L (ref 135–145)
Total Bilirubin: 0.6 mg/dL (ref 0.3–1.2)
Total Protein: 8.1 g/dL (ref 6.5–8.1)

## 2021-01-08 LAB — URINALYSIS, MICROSCOPIC (REFLEX)

## 2021-01-08 LAB — PREGNANCY, URINE: Preg Test, Ur: NEGATIVE

## 2021-01-08 MED ORDER — PANTOPRAZOLE SODIUM 20 MG PO TBEC: 20.0000 mg | DELAYED_RELEASE_TABLET | Freq: Every day | ORAL | 0 refills | Status: AC

## 2021-01-08 MED ORDER — SODIUM CHLORIDE 0.9 % IV BOLUS
1000.0000 mL | Freq: Once | INTRAVENOUS | Status: AC
  Administered 2021-01-08: 1000 mL via INTRAVENOUS

## 2021-01-08 MED ORDER — ONDANSETRON HCL 4 MG PO TABS: 4.0000 mg | ORAL_TABLET | Freq: Three times a day (TID) | ORAL | 0 refills | Status: AC | PRN

## 2021-01-08 MED ORDER — MORPHINE SULFATE (PF) 4 MG/ML IV SOLN
4.0000 mg | Freq: Once | INTRAVENOUS | Status: AC
Start: 2021-01-08 — End: 2021-01-08
  Administered 2021-01-08: 4 mg via INTRAVENOUS
  Filled 2021-01-08: qty 1

## 2021-01-08 MED ORDER — ONDANSETRON HCL 4 MG/2ML IJ SOLN
4.0000 mg | Freq: Once | INTRAMUSCULAR | Status: AC
  Administered 2021-01-08: 4 mg via INTRAVENOUS
  Filled 2021-01-08: qty 2

## 2021-01-08 MED ORDER — CEPHALEXIN 500 MG PO CAPS: 500.0000 mg | ORAL_CAPSULE | Freq: Two times a day (BID) | ORAL | 0 refills | Status: AC

## 2021-01-08 MED ORDER — SUCRALFATE 1 G PO TABS: 1.0000 g | ORAL_TABLET | Freq: Three times a day (TID) | ORAL | 0 refills | Status: AC

## 2021-01-08 NOTE — ED Provider Notes (Signed)
[facility] EMERGENCY DEPARTMENT Provider Note   CSN: [id number] Arrival date & time: 01/08/21  [phone]     History Chief Complaint  Patient presents with  . Vomiting    Susan Cross is a 30 y.o. female.  Patient presents to the emergency department for evaluation of nausea and vomiting.  Patient reports that symptoms began yesterday morning and have been continuous through the last 24 hours.  She reports that she cannot hold anything down.  Patient reports of burning pain in the central upper abdomen.  No diarrhea.  She has not had any fever or cold symptoms.  Denies any sick contacts.        Past Medical History:  Diagnosis Date  . Anemia   . Genital herpes   . Genital herpes    last was early in preg  . Infection    UTI    Patient Active Problem List   Diagnosis Date Noted  . 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 Normal intrauterine pregnancy in third trimester 04/14/2017    Past Surgical History:  Procedure Laterality Date  . CESAREAN SECTION N/A 04/14/2017   Procedure: CESAREAN SECTION;  Surgeon: Myna Hidalgo, DO;  Location: [facility];  Service: Obstetrics;  Laterality: N/A;  . NO PAST SURGERIES       OB History    Gravida  3   Para  1   Term  1   Preterm      AB  1   Living  1     SAB  1   IAB      Ectopic      Multiple  0   Live Births  1           Family History  Problem Relation Age of Onset  . Heart disease Father   . Cancer Maternal Aunt   . Cancer Maternal Grandmother   . Heart disease Paternal Grandmother     Social History   Tobacco Use  . Smoking status: Former Smoker    Types: Cigars  . Smokeless tobacco: Never Used  Vaping Use  . Vaping Use: Never used  Substance Use Topics  . Alcohol use: Not Currently    Comment: weekly  . Drug use: No    Home Medications Prior to Admission medications   Medication Sig Start Date End Date Taking? Authorizing Provider  sertraline  (ZOLOFT) 100 MG tablet Take 100 mg by mouth daily. 03/05/20  Yes [provider]  valACYclovir (VALTREX) 1000 MG tablet Take 1,000 mg by mouth daily. 06/24/20  Yes [provider]  ferrous sulfate 325 (65 FE) MG tablet Take 1 tablet (325 mg total) by mouth daily with breakfast. 04/17/17 07/06/20  Gerald Leitz, MD  promethazine (PHENERGAN) 25 MG tablet Take 1 tablet (25 mg total) by mouth every 6 (six) hours as needed for nausea. 06/22/18 07/06/20  Fayrene Helper, PA-C    Allergies    Patient has no known allergies.  Review of Systems   Review of Systems  Gastrointestinal: Positive for abdominal pain, nausea and vomiting.  All other systems reviewed and are negative.   Physical Exam Updated Vital Signs BP (!) 143/90   Pulse 81   Temp 98.1 F (36.7 C) (Oral)   Resp 18   Ht 5\' 2"  (1.575 m)   Wt 69.9 kg   LMP  (LMP Unknown)   SpO2 100%   BMI 28.17 kg/m   Physical Exam Vitals and nursing note reviewed.  Constitutional:      General: She is not in acute distress.    Appearance: Normal appearance. She is well-developed.  HENT:     Head: Normocephalic and atraumatic.     Right Ear: Hearing normal.     Left Ear: Hearing normal.     Nose: Nose normal.  Eyes:     Conjunctiva/sclera: Conjunctivae normal.     Pupils: Pupils are equal, round, and reactive to light.  Cardiovascular:     Rate and Rhythm: Regular rhythm.     Heart sounds: S1 normal and S2 normal. No murmur heard. No friction rub. No gallop.   Pulmonary:     Effort: Pulmonary effort is normal. No respiratory distress.     Breath sounds: Normal breath sounds.  Chest:     Chest wall: No tenderness.  Abdominal:     General: Bowel sounds are normal.     Palpations: Abdomen is soft.     Tenderness: There is abdominal tenderness in the epigastric area. There is no guarding or rebound. Negative signs include Murphy's sign and McBurney's sign.     Hernia: No hernia is present.  Musculoskeletal:         General: Normal range of motion.     Cervical back: Normal range of motion and neck supple.  Skin:    General: Skin is warm and dry.     Findings: No rash.  Neurological:     Mental Status: She is alert and oriented to person, place, and time.     GCS: GCS eye subscore is 4. GCS verbal subscore is 5. GCS motor subscore is 6.     Cranial Nerves: No cranial nerve deficit.     Sensory: No sensory deficit.     Coordination: Coordination normal.  Psychiatric:        Speech: Speech normal.        Behavior: Behavior normal.        Thought Content: Thought content normal.     ED Results / Procedures / Treatments   Labs (all labs ordered are listed, but only abnormal results are displayed) Labs Reviewed  CBC WITH DIFFERENTIAL/PLATELET  COMPREHENSIVE METABOLIC PANEL  LIPASE, BLOOD  URINALYSIS, ROUTINE W REFLEX MICROSCOPIC    EKG None  Radiology No results found.  Procedures Procedures   Medications Ordered in ED Medications  sodium chloride 0.9 % bolus 1,000 mL (has no administration in time range)  ondansetron (ZOFRAN) injection 4 mg (has no administration in time range)  morphine 4 MG/ML injection 4 mg (has no administration in time range)    ED Course  I have reviewed the triage vital signs and the nursing notes.  Pertinent labs & imaging results that were available during my care of the patient were reviewed by me and considered in my medical decision making (see chart for details).    MDM Rules/Calculators/A&P                          Patient presents with abdominal pain and vomiting that has been ongoing for 24 hours.  Pain is in the upper central epigastric region.  No pain or symptoms below the umbilicus.  No vaginal bleeding or urinary symptoms.  Patient to be provided symptomatic treatment, IV fluids and abdominal pain work-up initiated.  Will sign out to oncoming ER physician to follow-up on results.  Final Clinical Impression(s) / ED Diagnoses Final  diagnoses:  Non-intractable vomiting with nausea, unspecified vomiting type  Epigastric pain    Rx / DC Orders ED Discharge Orders    None       Nesiah Jump, Canary Brim, MD 01/08/21 [phone]

## 2021-01-08 NOTE — ED Triage Notes (Signed)
Pt c/o abd pain and vomiting since 9am yesterday. Denies diarrhea

## 2021-01-08 NOTE — ED Notes (Signed)
MD @ BS  Pt ot bathroom for urine sample -gait steady

## 2021-01-08 NOTE — ED Provider Notes (Signed)
Assumed care of patient at 7 AM.  Having nausea and vomiting and upper abdominal pain.  Seems to be more epigastric in nature.  Will evaluate for pancreatitis versus cholecystitis versus gastritis with lab work.  No diarrhea.  No suspicious food intake.  Vital signs are normal.  White count unremarkable.  Awaiting CMP, lipase, and urinalysis.  Feeling better after IV fluids, IV pain medicine and IV antiemetics.  Liver enzymes normal.  Bilirubin normal.  Lipase normal.  Doubt cholecystitis or pancreatitis.  Urinalysis appears to be with signs of infection.  Will treat with Keflex.  Will start Carafate and Protonix.  Will give Zofran for nausea.  Pregnancy test negative.  This chart was dictated using voice recognition software.  Despite best efforts to proofread,  errors can occur which can change the documentation meaning.     Virgina Norfolk, DO 01/08/21 [phone]

## 2021-04-14 ENCOUNTER — Encounter (HOSPITAL_BASED_OUTPATIENT_CLINIC_OR_DEPARTMENT_OTHER): Payer: Self-pay

## 2021-04-14 ENCOUNTER — Other Ambulatory Visit: Payer: Self-pay

## 2021-04-14 ENCOUNTER — Emergency Department (HOSPITAL_BASED_OUTPATIENT_CLINIC_OR_DEPARTMENT_OTHER)
Admission: EM | Admit: 2021-04-14 | Discharge: 2021-04-14 | Disposition: A | Discharge status: Home / Self Care | Payer: 59 | Attending: Emergency Medicine | Admitting: Emergency Medicine

## 2021-04-14 DIAGNOSIS — Z5321 Procedure and treatment not carried out due to patient leaving prior to being seen by health care provider: Secondary | ICD-10-CM | POA: Insufficient documentation

## 2021-04-14 DIAGNOSIS — Z8616 Personal history of COVID-19: Secondary | ICD-10-CM | POA: Insufficient documentation

## 2021-04-14 DIAGNOSIS — J029 Acute pharyngitis, unspecified: Secondary | ICD-10-CM | POA: Diagnosis not present

## 2021-04-14 NOTE — ED Triage Notes (Addendum)
Pt co f/u like sx x 5 days-states she had video call with PCP today-amoxil called in to treat sore throat-states started n/v after abx x 1-NAD-steady gait-reports +covid 2.5 weeks ago

## 2021-10-16 ENCOUNTER — Encounter (HOSPITAL_BASED_OUTPATIENT_CLINIC_OR_DEPARTMENT_OTHER): Payer: Self-pay | Admitting: *Deleted

## 2021-10-16 ENCOUNTER — Other Ambulatory Visit: Payer: Self-pay

## 2021-10-16 ENCOUNTER — Emergency Department (HOSPITAL_BASED_OUTPATIENT_CLINIC_OR_DEPARTMENT_OTHER)
Admission: EM | Admit: 2021-10-16 | Discharge: 2021-10-16 | Disposition: A | Discharge status: Home / Self Care | Payer: 59 | Attending: Emergency Medicine | Admitting: Emergency Medicine

## 2021-10-16 ENCOUNTER — Emergency Department (HOSPITAL_BASED_OUTPATIENT_CLINIC_OR_DEPARTMENT_OTHER): Payer: 59

## 2021-10-16 DIAGNOSIS — N760 Acute vaginitis: Secondary | ICD-10-CM | POA: Insufficient documentation

## 2021-10-16 DIAGNOSIS — R1031 Right lower quadrant pain: Secondary | ICD-10-CM

## 2021-10-16 DIAGNOSIS — Z87891 Personal history of nicotine dependence: Secondary | ICD-10-CM | POA: Diagnosis not present

## 2021-10-16 DIAGNOSIS — B9689 Other specified bacterial agents as the cause of diseases classified elsewhere: Secondary | ICD-10-CM | POA: Diagnosis not present

## 2021-10-16 LAB — COMPREHENSIVE METABOLIC PANEL
ALT: 23 U/L (ref 0–44)
AST: 22 U/L (ref 15–41)
Albumin: 3.8 g/dL (ref 3.5–5.0)
Alkaline Phosphatase: 49 U/L (ref 38–126)
Anion gap: 8 (ref 5–15)
BUN: 9 mg/dL (ref 6–20)
CO2: 24 mmol/L (ref 22–32)
Calcium: 8.7 mg/dL — ABNORMAL LOW (ref 8.9–10.3)
Chloride: 105 mmol/L (ref 98–111)
Creatinine, Ser: 0.67 mg/dL (ref 0.44–1.00)
GFR, Estimated: >60 mL/min (ref >60)
Glucose, Bld: 75 mg/dL (ref 70–99)
Potassium: 3.5 mmol/L (ref 3.5–5.1)
Sodium: 137 mmol/L (ref 135–145)
Total Bilirubin: 0.4 mg/dL (ref 0.3–1.2)
Total Protein: 6.9 g/dL (ref 6.5–8.1)

## 2021-10-16 LAB — CBC WITH DIFFERENTIAL/PLATELET
Abs Immature Granulocytes: 0.01 10*3/uL (ref 0.00–0.07)
Basophils Absolute: 0 10*3/uL (ref 0.0–0.1)
Basophils Relative: 0 %
Eosinophils Absolute: 0.1 10*3/uL (ref 0.0–0.5)
Eosinophils Relative: 2 %
HCT: 39.1 % (ref 36.0–46.0)
Hemoglobin: 13.3 g/dL (ref 12.0–15.0)
Immature Granulocytes: 0 %
Lymphocytes Relative: 43 %
Lymphs Abs: 3 10*3/uL (ref 0.7–4.0)
MCH: 28.9 pg (ref 26.0–34.0)
MCHC: 34 g/dL (ref 30.0–36.0)
MCV: 84.8 fL (ref 80.0–100.0)
Monocytes Absolute: 0.5 10*3/uL (ref 0.1–1.0)
Monocytes Relative: 8 %
Neutro Abs: 3.3 10*3/uL (ref 1.7–7.7)
Neutrophils Relative %: 47 %
Platelets: 303 10*3/uL (ref 150–400)
RBC: 4.61 MIL/uL (ref 3.87–5.11)
RDW: 13.2 % (ref 11.5–15.5)
WBC: 6.9 10*3/uL (ref 4.0–10.5)
nRBC: 0 % (ref 0.0–0.2)

## 2021-10-16 LAB — URINALYSIS, MICROSCOPIC (REFLEX)

## 2021-10-16 LAB — URINALYSIS, ROUTINE W REFLEX MICROSCOPIC
Bilirubin Urine: NEGATIVE
Glucose, UA: NEGATIVE mg/dL
Ketones, ur: NEGATIVE mg/dL
Nitrite: NEGATIVE
Protein, ur: NEGATIVE mg/dL
Specific Gravity, Urine: 1.02 (ref 1.005–1.030)
pH: 6 (ref 5.0–8.0)

## 2021-10-16 LAB — PREGNANCY, URINE: Preg Test, Ur: NEGATIVE

## 2021-10-16 LAB — WET PREP, GENITAL
Sperm: NONE SEEN
Trich, Wet Prep: NONE SEEN
WBC, Wet Prep HPF POC: <10 (ref <10)
Yeast Wet Prep HPF POC: NONE SEEN

## 2021-10-16 LAB — LIPASE, BLOOD: Lipase: 25 U/L (ref 11–51)

## 2021-10-16 MED ORDER — METRONIDAZOLE 1 % EX GEL: Freq: Every day | CUTANEOUS | 0 refills | Status: AC

## 2021-10-16 MED ORDER — KETOROLAC TROMETHAMINE 30 MG/ML IJ SOLN
30.0000 mg | Freq: Once | INTRAMUSCULAR | Status: AC
  Administered 2021-10-16: 30 mg via INTRAVENOUS
  Filled 2021-10-16: qty 1

## 2021-10-16 MED ORDER — IOHEXOL 300 MG/ML  SOLN
80.0000 mL | Freq: Once | INTRAMUSCULAR | Status: AC | PRN
  Administered 2021-10-16: 80 mL via INTRAVENOUS

## 2021-10-16 NOTE — ED Triage Notes (Signed)
Pt c/o RLQ since yesterday, worse with cough and movement

## 2021-10-16 NOTE — ED Notes (Signed)
ED Provider at bedside. 

## 2021-10-16 NOTE — Discharge Instructions (Signed)
You are seen in the emergency room today with right lower quadrant abdominal pain.  Your CT scan did not show evidence of appendicitis or other abnormality causing your pain.  This may be musculoskeletal.  I am treating you for bacterial vaginosis.  If you develop sudden worsening pain in your right lower quadrant you should return for reevaluation.  Please follow close with your primary care doctor in the next 1 to 2 weeks.

## 2021-10-16 NOTE — ED Notes (Signed)
Spoke with Camilla in lab to add on urine gc/chlamydia

## 2021-10-16 NOTE — ED Provider Notes (Signed)
Emergency Department Provider Note   I have reviewed the triage vital signs and the nursing notes.   HISTORY  Chief Complaint Abdominal Pain   HPI Susan Cross is a 31 y.o. female presents to the ED with 2 days of RLQ abdominal pain.  Patient reports gradually worsening pain over the past 2 days but worse with movement such as coughing, sitting up, walking.  She is not having fevers.  No vomiting or diarrhea.  No vaginal bleeding or discharge.  No dysuria, hesitancy, urgency.  No similar pain like this in the past.   Past Medical History:  Diagnosis Date   Anemia    Genital herpes    Genital herpes    last was early in preg   Infection    UTI    Review of Systems  Constitutional: No fever/chills Eyes: No visual changes. ENT: No sore throat. Cardiovascular: Denies chest pain. Respiratory: Denies shortness of breath. Gastrointestinal: Positive RLQ abdominal pain.  No nausea, no vomiting.  No diarrhea.  No constipation. Genitourinary: Negative for dysuria. Musculoskeletal: Negative for back pain. Skin: Negative for rash. Neurological: Negative for headaches.    ____________________________________________   PHYSICAL EXAM:  VITAL SIGNS: ED Triage Vitals  Enc Vitals Group     BP 10/16/21 1357 112/88     Pulse Rate 10/16/21 1357 96     Resp 10/16/21 1357 20     Temp 10/16/21 1357 98.6 F (37 C)     Temp Source 10/16/21 1357 Oral     SpO2 10/16/21 1357 98 %     Weight 10/16/21 1356 150 lb (68 kg)     Height 10/16/21 1356 5\' 2"  (1.575 m)   Constitutional: Alert and oriented. Well appearing and in no acute distress. Eyes: Conjunctivae are normal.  Head: Atraumatic. Nose: No congestion/rhinnorhea. Mouth/Throat: Mucous membranes are moist.  Neck: No stridor.  Cardiovascular: Normal rate, regular rhythm. Good peripheral circulation. Grossly normal heart sounds.   Respiratory: Normal respiratory effort.  No retractions. Lungs CTAB. Gastrointestinal: Soft with  mild RLQ tenderness. No rebound. No distention.  Musculoskeletal: No lower extremity tenderness nor edema. No gross deformities of extremities. Neurologic:  Normal speech and language. No gross focal neurologic deficits are appreciated.  Skin:  Skin is warm, dry and intact. No rash noted.  ____________________________________________   LABS (all labs ordered are listed, but only abnormal results are displayed)  Labs Reviewed  URINE CULTURE - Abnormal; Notable for the following components:      Result Value   Culture MULTIPLE SPECIES PRESENT, SUGGEST RECOLLECTION (*)    All other components within normal limits  WET PREP, GENITAL - Abnormal; Notable for the following components:   Clue Cells Wet Prep HPF POC PRESENT (*)    All other components within normal limits  URINALYSIS, ROUTINE W REFLEX MICROSCOPIC - Abnormal; Notable for the following components:   APPearance CLOUDY (*)    Hgb urine dipstick SMALL (*)    Leukocytes,Ua TRACE (*)    All other components within normal limits  COMPREHENSIVE METABOLIC PANEL - Abnormal; Notable for the following components:   Calcium 8.7 (*)    All other components within normal limits  URINALYSIS, MICROSCOPIC (REFLEX) - Abnormal; Notable for the following components:   Bacteria, UA MANY (*)    All other components within normal limits  PREGNANCY, URINE  CBC WITH DIFFERENTIAL/PLATELET  LIPASE, BLOOD  GC/CHLAMYDIA PROBE AMP ([place]) NOT AT [facility]   ____________________________________________  RADIOLOGY  CT abdomen/pelvis  ____________________________________________  PROCEDURES  Procedure(s) performed:   Procedures  None ____________________________________________   INITIAL IMPRESSION / ASSESSMENT AND PLAN / ED COURSE  Pertinent labs & imaging results that were available during my care of the patient were reviewed by me and considered in my medical decision making (see chart for details).   This patient is Presenting for  Evaluation of RLQ abdominal pain, which does require a range of treatment options, and is a complaint that involves a high risk of morbidity and mortality.  The Differential Diagnoses includes but is not exclusive to ectopic pregnancy, ovarian cyst, ovarian torsion, acute appendicitis, urinary tract infection, endometriosis, bowel obstruction, hernia, colitis, renal colic, gastroenteritis, volvulus etc.   Critical Interventions- IV toradol   Medications  ketorolac (TORADOL) 30 MG/ML injection 30 mg (30 mg Intravenous Given 10/16/21 1516)  iohexol (OMNIPAQUE) 300 MG/ML solution 80 mL (80 mLs Intravenous Contrast Given 10/16/21 1522)    Reassessment after intervention:  pain improved.    I decided to review pertinent External Data, and in summary no recent ED visits with similar symptoms.   Clinical Laboratory Tests Ordered, included   Radiologic Tests Ordered, included CT abdomen/pelvis. I independently interpreted the images and agree with radiology interpretation.   Cardiac Monitor Tracing which shows NSR.   Social Determinants of Health Risk with a smoking history.    Medical Decision Making: Summary:  Presents emergency department with right lower quadrant abdominal pain.  She has no peritonitis on exam.  CT imaging visualizes the appendix well and shows no abnormality.  The adnexa are similarly unremarkable with no ovarian cyst, stranding, enlarged ovaries to suggest torsion.  Given her exam and fairly mild symptoms I do not feel that Doppler ultrasound is required at this time.  I discussed this with the patient.  Will have her self swab and reevaluate after. Stronger suspicion here for MSK related pain.   Disposition: discharge  ____________________________________________  FINAL CLINICAL IMPRESSION(S) / ED DIAGNOSES  Final diagnoses:  Right lower quadrant abdominal pain  Bacterial vaginosis     NEW OUTPATIENT MEDICATIONS STARTED DURING THIS VISIT:  Discharge Medication  List as of 10/16/2021  5:17 PM     START taking these medications   Details  metroNIDAZOLE (METROGEL) 1 % gel Apply topically daily for 7 days., Starting Sat 10/16/2021, Until Sat 10/23/2021, Normal        Note:  This document was prepared using Dragon voice recognition software and may include unintentional dictation errors.  Nanda Quinton, MD, [facility]    Burrell Hodapp, W[facility], MD 10/26/21 [phone]

## 2021-10-18 LAB — URINE CULTURE

## 2021-10-18 LAB — GC/CHLAMYDIA PROBE AMP (~~LOC~~) NOT AT ARMC
Chlamydia: NEGATIVE
Comment: NEGATIVE
Comment: NORMAL
Neisseria Gonorrhea: NEGATIVE

## 2023-06-09 IMAGING — CT CT ABD-PELV W/ CM
2 of 4 series · 16 of 46 positions shown, 18 images · IV contrast (Omnipaque)
Comparison: CT AP 06/22/2018

CLINICAL DATA: Right lower quadrant abdominal pain

EXAM:
CT ABDOMEN AND PELVIS WITH CONTRAST
TECHNIQUE: Multidetector CT imaging of the abdomen and pelvis was performed
using the standard protocol following bolus administration of
intravenous contrast.

[Series 2: axial st · axial · 0.75mm/px · z∈[+647,+1067]mm · 13 of 92 slices shown, 15 images]
[im 4/92  soft-tissue]
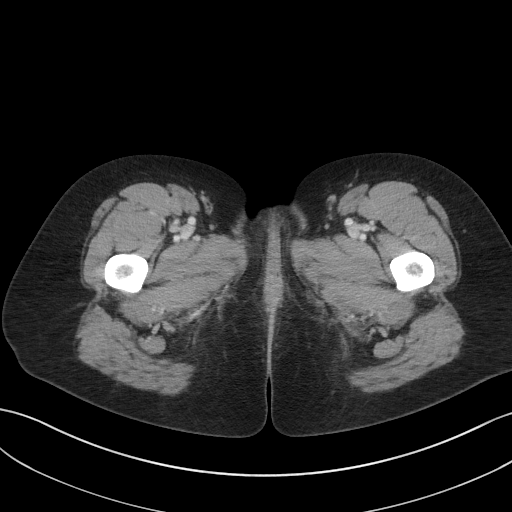
[im 4/92  bone]
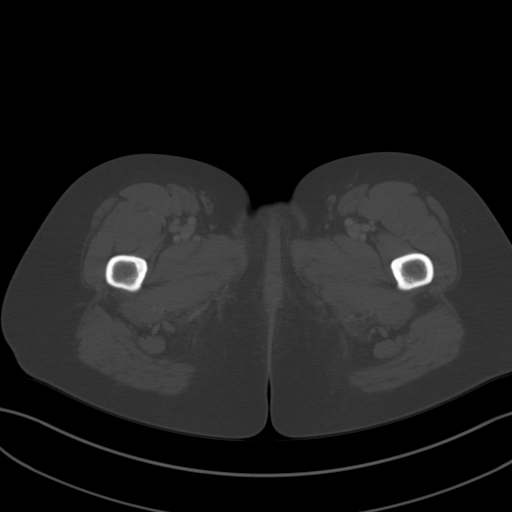
[im 12/92  soft-tissue]
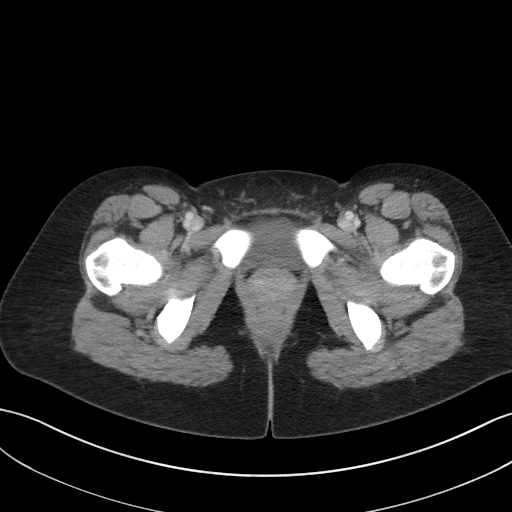
[im 19/92  soft-tissue]
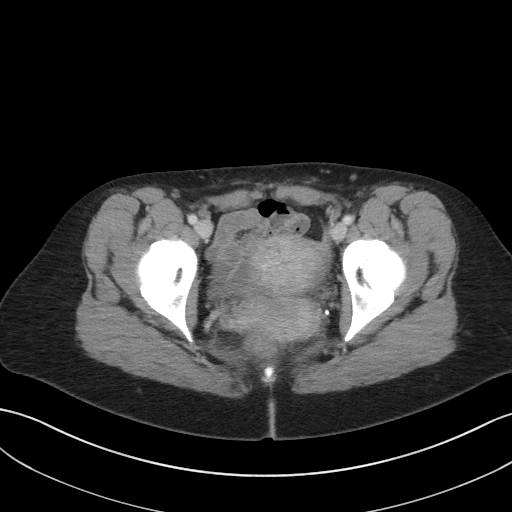
[im 27/92  soft-tissue]
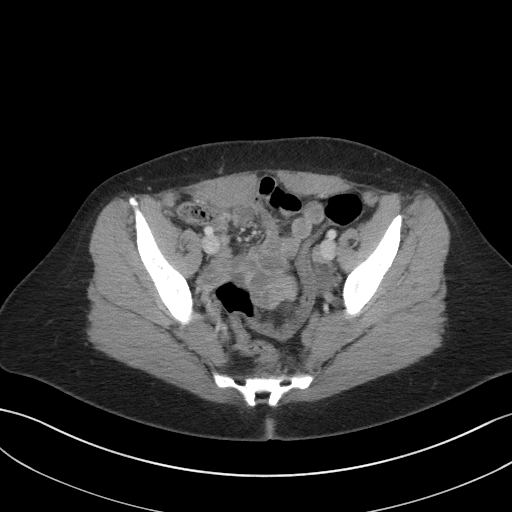
[im 31/92  soft-tissue]
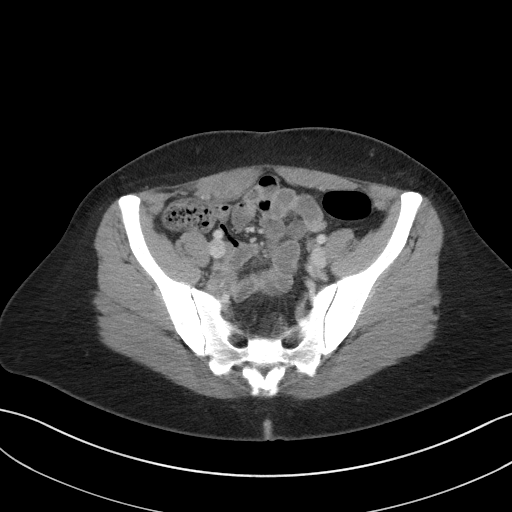
[im 38/92  soft-tissue]
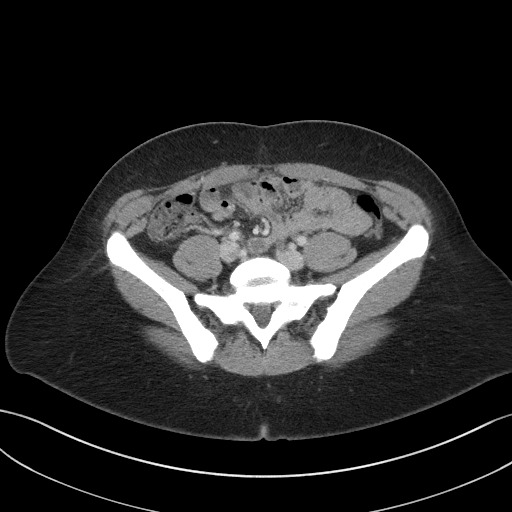
[im 46/92  soft-tissue]
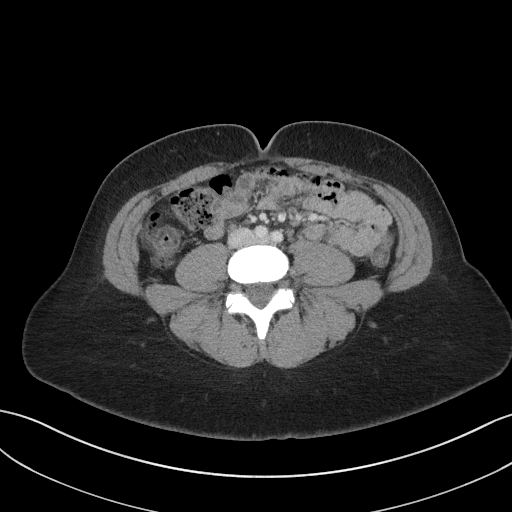
[im 54/92  soft-tissue]
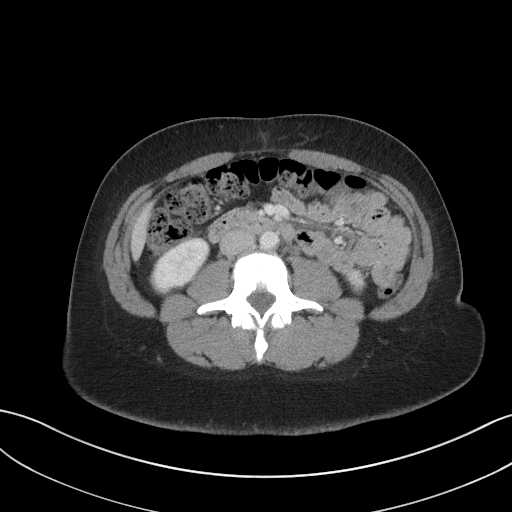
[im 61/92  soft-tissue]
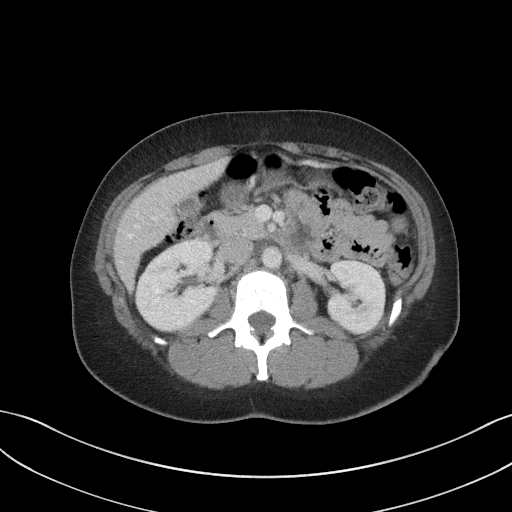
[im 61/92  bone]
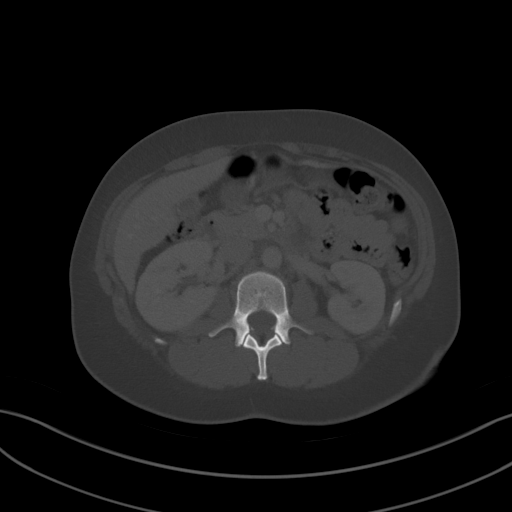
[im 65/92  soft-tissue]
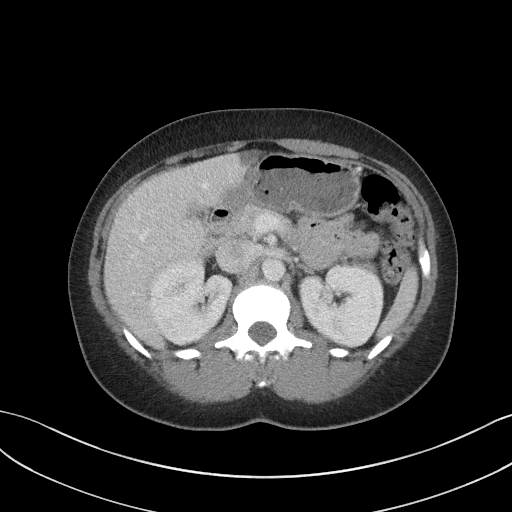
[im 73/92  soft-tissue]
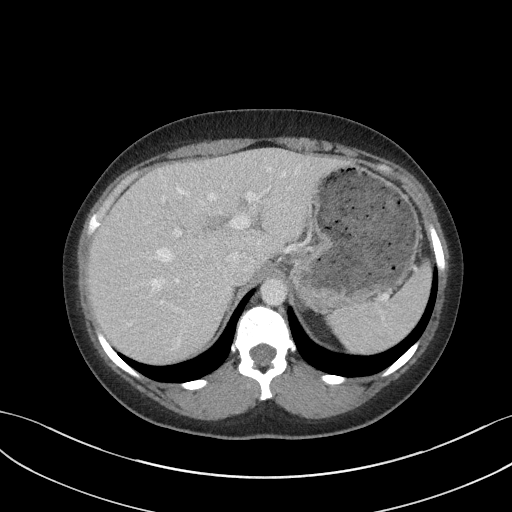
[im 80/92  soft-tissue]
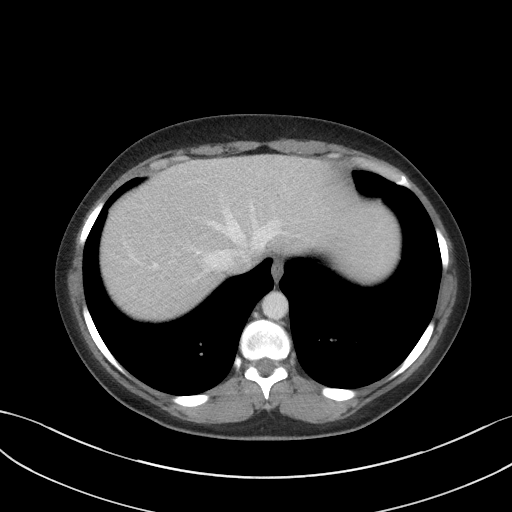
[im 88/92  soft-tissue]
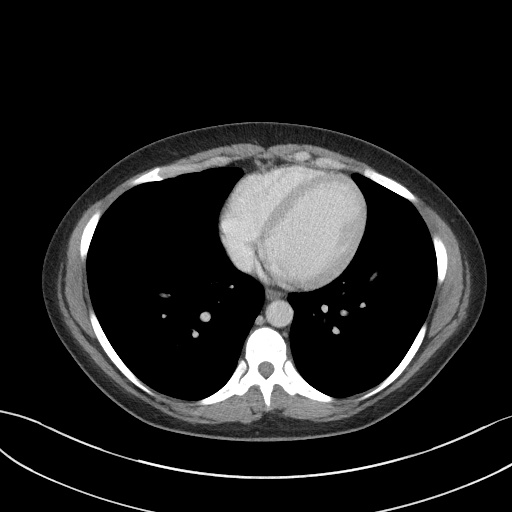

[Series 5: coronal st · coronal · 0.68mm/px · 3 of 85 slices shown]
[im 29/85  soft-tissue]
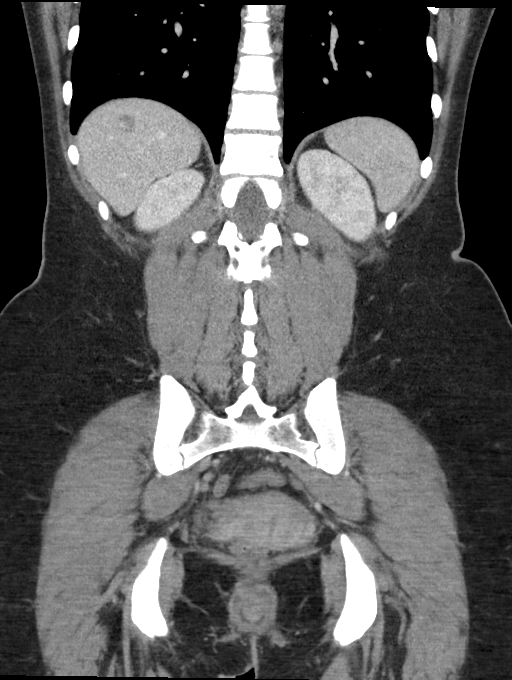
[im 38/85  soft-tissue]
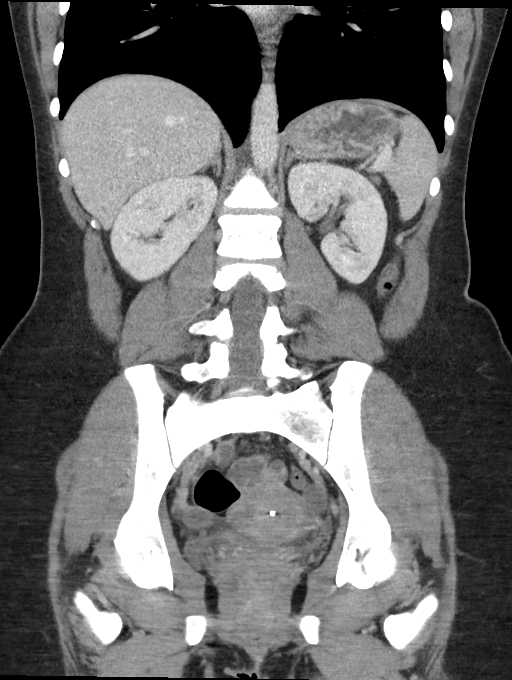
[im 47/85  soft-tissue]
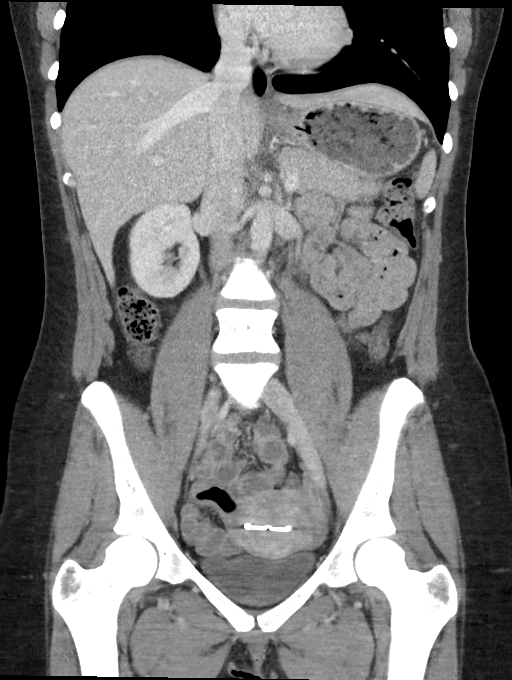

[16 of 46 positions shown; findings below may reference images not displayed]

RADIATION DOSE REDUCTION: This exam was performed according to the
departmental dose-optimization program which includes automated
exposure control, adjustment of the mA and/or kV according to
patient size and/or use of iterative reconstruction technique.

CONTRAST:  80mL OMNIPAQUE IOHEXOL 300 MG/ML  SOLN
FINDINGS: Lower chest: No acute abnormality. Low-density lesion along the dome
of liver measures 2.3 cm and is unchanged from 06/22/2018, most
likely benign hemangioma. Similarly within the posterior right lobe
of liver there is a stable 9 mm low-attenuation structure, image
[DATE]. Also compatible with a benign process. No suspicious liver
lesions identified at this time. Gallbladder appears normal.

Hepatobiliary: No focal liver abnormality is seen. No gallstones,
gallbladder wall thickening, or biliary dilatation.

Pancreas: Unremarkable. No pancreatic ductal dilatation or
surrounding inflammatory changes.

Spleen: Normal in size without focal abnormality.

Adrenals/Urinary Tract: Normal adrenal glands. No nephrolithiasis or
hydronephrosis identified bilaterally. The urinary bladder is
unremarkable.

Stomach/Bowel: Stomach appears normal. The appendix is visualized
and is within normal limits. No bowel wall thickening, inflammation,
or distension.

Vascular/Lymphatic: No abdominopelvic adenopathy.

Reproductive: Uterus and bilateral adnexa are unremarkable. IUD in
place.

Other: No free fluid or fluid collections

Musculoskeletal: No acute or significant osseous findings.
IMPRESSION: 1. No acute findings within the abdomen or pelvis.
2. Normal appendix.
